# Patient Record
Sex: Male | Born: 1975 | Race: White | Hispanic: No | Marital: Single | Smoking: Current every day smoker
Health system: Southern US, Community
[De-identification: ages and names within clinical notes are randomized; demographics above are authoritative.]

## PROBLEM LIST (undated history)

## (undated) DIAGNOSIS — L299 Pruritus, unspecified: Secondary | ICD-10-CM

## (undated) DIAGNOSIS — R42 Dizziness and giddiness: Secondary | ICD-10-CM

## (undated) DIAGNOSIS — R531 Weakness: Secondary | ICD-10-CM

## (undated) DIAGNOSIS — U071 COVID-19: Secondary | ICD-10-CM

## (undated) DIAGNOSIS — L02414 Cutaneous abscess of left upper limb: Secondary | ICD-10-CM

## (undated) DIAGNOSIS — R55 Syncope and collapse: Secondary | ICD-10-CM

## (undated) DIAGNOSIS — R52 Pain, unspecified: Secondary | ICD-10-CM

## (undated) DIAGNOSIS — R0602 Shortness of breath: Secondary | ICD-10-CM

## (undated) DIAGNOSIS — R079 Chest pain, unspecified: Secondary | ICD-10-CM

## (undated) HISTORY — DX: Pain, unspecified: R52

## (undated) HISTORY — DX: Dizziness and giddiness: R42

## (undated) HISTORY — PX: NOSE SURGERY: SHX723

## (undated) HISTORY — DX: COVID-19: U07.1

## (undated) HISTORY — PX: CLAVICLE SURGERY: SHX598

## (undated) HISTORY — DX: Shortness of breath: R06.02

## (undated) HISTORY — DX: Syncope and collapse: R55

## (undated) HISTORY — DX: Chest pain, unspecified: R07.9

## (undated) HISTORY — DX: Pruritus, unspecified: L29.9

## (undated) HISTORY — DX: Weakness: R53.1

## (undated) HISTORY — DX: Cutaneous abscess of left upper limb: L02.414

---

## 2010-01-01 ENCOUNTER — Emergency Department (HOSPITAL_COMMUNITY): Admission: EM | Admit: 2010-01-01 | Discharge: 2010-01-01 | Payer: Self-pay | Admitting: Family Medicine

## 2011-09-23 ENCOUNTER — Emergency Department (HOSPITAL_COMMUNITY)
Admission: EM | Admit: 2011-09-23 | Discharge: 2011-09-23 | Disposition: A | Discharge status: Home / Self Care | Payer: Self-pay | Attending: Emergency Medicine | Admitting: Emergency Medicine

## 2011-09-23 DIAGNOSIS — L02219 Cutaneous abscess of trunk, unspecified: Secondary | ICD-10-CM | POA: Insufficient documentation

## 2012-02-19 ENCOUNTER — Observation Stay (HOSPITAL_COMMUNITY)
Admission: EM | Admit: 2012-02-19 | Discharge: 2012-02-19 | Disposition: A | Discharge status: Home Health | Payer: Self-pay | Attending: Emergency Medicine | Admitting: Emergency Medicine

## 2012-02-19 ENCOUNTER — Encounter (HOSPITAL_COMMUNITY): Payer: Self-pay | Admitting: *Deleted

## 2012-02-19 ENCOUNTER — Observation Stay (HOSPITAL_COMMUNITY): Payer: Self-pay

## 2012-02-19 ENCOUNTER — Encounter (HOSPITAL_COMMUNITY): Payer: Self-pay | Admitting: Emergency Medicine

## 2012-02-19 ENCOUNTER — Emergency Department (INDEPENDENT_AMBULATORY_CARE_PROVIDER_SITE_OTHER)
Admission: EM | Admit: 2012-02-19 | Discharge: 2012-02-19 | Disposition: A | Discharge status: Nursing Home (Includes ICF) | Payer: Self-pay | Source: Home / Self Care | Attending: Emergency Medicine | Admitting: Emergency Medicine

## 2012-02-19 DIAGNOSIS — IMO0002 Reserved for concepts with insufficient information to code with codable children: Secondary | ICD-10-CM

## 2012-02-19 DIAGNOSIS — L02414 Cutaneous abscess of left upper limb: Secondary | ICD-10-CM

## 2012-02-19 DIAGNOSIS — L0291 Cutaneous abscess, unspecified: Secondary | ICD-10-CM

## 2012-02-19 DIAGNOSIS — I891 Lymphangitis: Secondary | ICD-10-CM

## 2012-02-19 DIAGNOSIS — F17200 Nicotine dependence, unspecified, uncomplicated: Secondary | ICD-10-CM | POA: Insufficient documentation

## 2012-02-19 LAB — BASIC METABOLIC PANEL
CO2: 26 mEq/L (ref 19–32)
Chloride: 96 mEq/L (ref 96–112)
Glucose, Bld: 124 mg/dL — ABNORMAL HIGH (ref 70–99)
Sodium: 133 mEq/L — ABNORMAL LOW (ref 135–145)

## 2012-02-19 LAB — CBC
Hemoglobin: 12.5 g/dL — ABNORMAL LOW (ref 13.0–17.0)
Platelets: 465 10*3/uL — ABNORMAL HIGH (ref 150–400)
RBC: 4.15 MIL/uL — ABNORMAL LOW (ref 4.22–5.81)
WBC: 11.4 10*3/uL — ABNORMAL HIGH (ref 4.0–10.5)

## 2012-02-19 MED ORDER — FENTANYL CITRATE 0.05 MG/ML IJ SOLN
100.0000 ug | Freq: Once | INTRAMUSCULAR | Status: AC
  Administered 2012-02-19: 100 ug via INTRAVENOUS
  Filled 2012-02-19: qty 2

## 2012-02-19 MED ORDER — ZOLPIDEM TARTRATE 5 MG PO TABS: 5.0000 mg | ORAL_TABLET | Freq: Every evening | ORAL | Status: DC | PRN

## 2012-02-19 MED ORDER — VANCOMYCIN HCL IN DEXTROSE 1-5 GM/200ML-% IV SOLN: 1000.0000 mg | Freq: Two times a day (BID) | INTRAVENOUS | Status: DC

## 2012-02-19 MED ORDER — OXYCODONE-ACETAMINOPHEN 5-325 MG PO TABS: 1.0000 | ORAL_TABLET | ORAL | Status: AC | PRN

## 2012-02-19 MED ORDER — AMOXICILLIN-POT CLAVULANATE 875-125 MG PO TABS: 1.0000 | ORAL_TABLET | Freq: Two times a day (BID) | ORAL | Status: AC

## 2012-02-19 MED ORDER — HYDROMORPHONE HCL PF 1 MG/ML IJ SOLN
1.0000 mg | Freq: Once | INTRAMUSCULAR | Status: AC
  Administered 2012-02-19: 1 mg via INTRAVENOUS
  Filled 2012-02-19: qty 1

## 2012-02-19 MED ORDER — ONDANSETRON HCL 4 MG/2ML IJ SOLN
4.0000 mg | Freq: Four times a day (QID) | INTRAMUSCULAR | Status: DC | PRN
  Administered 2012-02-19: 4 mg via INTRAVENOUS
  Filled 2012-02-19: qty 2

## 2012-02-19 MED ORDER — MORPHINE SULFATE 4 MG/ML IJ SOLN
4.0000 mg | INTRAMUSCULAR | Status: DC | PRN
  Administered 2012-02-19 (×2): 4 mg via INTRAVENOUS
  Filled 2012-02-19 (×2): qty 1

## 2012-02-19 MED ORDER — VANCOMYCIN HCL IN DEXTROSE 1-5 GM/200ML-% IV SOLN
1000.0000 mg | Freq: Once | INTRAVENOUS | Status: AC
  Administered 2012-02-19: 1000 mg via INTRAVENOUS
  Filled 2012-02-19: qty 200

## 2012-02-19 NOTE — ED Notes (Signed)
Patient transported to Ultrasound 

## 2012-02-19 NOTE — ED Notes (Signed)
Returned from ultrasound.

## 2012-02-19 NOTE — ED Notes (Signed)
Patient sent from Urgent Care for evaluation of his upper left arm.  Patient left upper arm is red and swollen and warm to touch.  Patient noticed the abcess on Monday

## 2012-02-19 NOTE — ED Provider Notes (Signed)
History     CSN: [id number]  Arrival date & time 02/19/12  1047   First MD Initiated Contact with Patient 02/19/12 1234      Chief Complaint  Patient presents with  . Cellulitis    (Consider location/radiation/quality/duration/timing/severity/associated sxs/prior treatment) HPI Patient presents as a referral from urgent care do to abscess and cellulitis of his left upper extremity. He states that he noted the infection approximately 3 days ago. There is a large area of swelling and pain on the medial side of his left upper extremity. The redness extends to his axilla and down to his wrist on the left side. He denies fevers chills nausea vomiting. He states that the redness improved since yesterday as he took a friend's ciprofloxacin. Symptoms are worse with movement and palpation. There no other associated systemic symptoms. There's been no drainage from the wound. 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  Past Medical History  Diagnosis Date  . Abscess of arm, left     upper   . Weakness     History reviewed. No pertinent past surgical history.  Family History  Problem Relation Age of Onset  . Cancer Mother     lung  . Cancer Father     lung    History  Substance Use Topics  . Smoking status: Current Everyday Smoker -- 1.0 packs/day    Types: Cigarettes  . Smokeless tobacco: Never Used  . Alcohol Use: Yes     daily      Review of Systems ROS reviewed and otherwise negative except for mentioned in HPI  Allergies  Review of patient's allergies indicates no known allergies.  Home Medications   Current Outpatient Rx  Name Route Sig Dispense Refill  . AMOXICILLIN-POT CLAVULANATE 875-125 MG PO TABS Oral Take 1 tablet by mouth 2 (two) times daily. 20 tablet 0  . OXYCODONE-ACETAMINOPHEN 5-325 MG PO TABS Oral Take 1 tablet by mouth every 4 (four) hours as needed for pain. 30 tablet 0    BP 105/57  Pulse 80  Temp(Src)  97.5 F (36.4 C) (Oral)  Resp 16  SpO2 97% Vitals reviewed Physical Exam Physical Examination: General appearance - alert, well appearing, and in no distress Mental status - alert, oriented to person, place, and time Mouth - mucous membranes moist, pharynx normal without lesions Chest - clear to auscultation, no wheezes, rales or rhonchi, symmetric air entry Heart - normal rate, regular rhythm, normal S1, S2, no murmurs, rubs, clicks or gallops Abdomen - soft, nontender, nondistended, no masses or organomegaly Musculoskeletal - no joint tenderness, deformity or swelling Extremities - peripheral pulses normal, no pedal edema, no clubbing or cyanosis Skin - normal skin turgor, erythema overlying left upper extremity on medial surface from axilla to wrist, large erythematous nodule approx 5cm over medial surface of upper arm just proximal to elbow, + fluctuant, no overlying bruit or pulse, + ttp  ED Course  Procedures (including critical care time)  5:02 PM discussed patient and ultrasound findings with Dr. Lindie Spruce, surgery- I have reviewed the ultrasound as well.  Surgery will consult on patient in the CDU on cellulitis protocol.  IV vanc ordered.   5:20 PM Dr. Lindie Spruce is at bedside, plans to drain abscess  Labs Reviewed  CBC - Abnormal; Notable for the following:    WBC 11.4 (*)    RBC 4.15 (*)    Hemoglobin 12.5 (*)    HCT 37.1 (*)  Platelets 465 (*)    All other components within normal limits  BASIC METABOLIC PANEL - Abnormal; Notable for the following:    Sodium 133 (*)    Potassium 3.4 (*)    Glucose, Bld 124 (*)    All other components within normal limits  CULTURE, BLOOD (ROUTINE X 2)  CULTURE, BLOOD (ROUTINE X 2)  WOUND CULTURE   No results found.   1. Abscess       MDM  Patient presenting with left upper extremity cellulitis and most likely abscess of his left upper arm. Area is large approximately 5 or 6 cm in diameter. We'll do to proximity to vasculature of  upper arm and ultrasound was obtained. I suspect DVT to be much less likely. On ultrasound there is a large complicated area of fluid with some vascularity associated. Patient had been placed on the CDU for cellulitis protocol and given IV vancomycin and do to the surrounding cellulitis. I discussed his case with Dr. Lindie Spruce who has seen patient in the CDU and is planning to drain the abscess. After Dr. Lindie Spruce saw the patient he discharged this patient on oral antibiotics to followup in his clinic in 2 days' time. I was not consulted about the plan for discharge        Ethelda Chick, MD 02/22/12 [phone]

## 2012-02-19 NOTE — ED Notes (Signed)
Denies injury. Skin marked and dated per protocol. Awaiting vascular study.

## 2012-02-19 NOTE — ED Notes (Signed)
Pt states he has had an arm abscess since Monday. The swelling and redness was all the way down to his wrist but got better with some Cipro of his friends he took for a few days. Pt has a past history of IV drug use.

## 2012-02-19 NOTE — Discharge Instructions (Signed)
Change the outer dressing tomorrow. Leave the packing in. Call Dr. Lindie Spruce for an appointment to be seen on Friday. Abscess An abscess (boil or furuncle) is an infected area that contains a collection of pus.  SYMPTOMS Signs and symptoms of an abscess include pain, tenderness, redness, or hardness. You may feel a moveable soft area under your skin. An abscess can occur anywhere in the body.  TREATMENT  A surgical cut (incision) may be made over your abscess to drain the pus. Gauze may be packed into the space or a drain may be looped through the abscess cavity (pocket). This provides a drain that will allow the cavity to heal from the inside outwards. The abscess may be painful for a few days, but should feel much better if it was drained.  Your abscess, if seen early, may not have localized and may not have been drained. If not, another appointment may be required if it does not get better on its own or with medications. HOME CARE INSTRUCTIONS   Only take over-the-counter or prescription medicines for pain, discomfort, or fever as directed by your caregiver.   Take your antibiotics as directed if they were prescribed. Finish them even if you start to feel better.   Keep the skin and clothes clean around your abscess.   If the abscess was drained, you will need to use gauze dressing to collect any draining pus. Dressings will typically need to be changed 3 or more times a day.   The infection may spread by skin contact with others. Avoid skin contact as much as possible.   Practice good hygiene. This includes regular hand washing, cover any draining skin lesions, and do not share personal care items.   If you participate in sports, do not share athletic equipment, towels, whirlpools, or personal care items. Shower after every practice or tournament.   If a draining area cannot be adequately covered:   Do not participate in sports.   Children should not participate in day care until the wound  has healed or drainage stops.   If your caregiver has given you a follow-up appointment, it is very important to keep that appointment. Not keeping the appointment could result in a much worse infection, chronic or permanent injury, pain, and disability. If there is any problem keeping the appointment, you must call back to this facility for assistance.  SEEK MEDICAL CARE IF:   You develop increased pain, swelling, redness, drainage, or bleeding in the wound site.   You develop signs of generalized infection including muscle aches, chills, fever, or a general ill feeling.   You have an oral temperature above 102 F (38.9 C).  MAKE SURE YOU:   Understand these instructions.   Will watch your condition.   Will get help right away if you are not doing well or get worse.  Document Released: 09/25/2005 Document Revised: 08/28/2011 Document Reviewed: 07/19/2008 [facility] Patient Information 2012 [place], [place].

## 2012-02-19 NOTE — Consult Note (Signed)
Reason for Consult:left arm abscess Referring Physician: Dr. Oran Rein Jorge Carter is an 36 y.o. male.  HPI: Several days of pain and swelling in left upper extremity.  Has gone down now some with the use of a friends antibiotic, Cipro.  History reviewed. No pertinent past medical history.  History reviewed. No pertinent past surgical history.  History reviewed. No pertinent family history.  Social History:  reports that he has been smoking Cigarettes.  He has been smoking about 1 pack per day. He does not have any smokeless tobacco history on file. He reports that he drinks alcohol. His drug history not on file.  Allergies: No Known Allergies  Medications: I have reviewed the patient's current medications.  Results for orders placed during the hospital encounter of 02/19/12 (from the past 48 hour(s))  CBC     Status: Abnormal   Collection Time   02/19/12  1:20 PM      Component Value Range Comment   WBC 11.4 (*) 4.0 - 10.5 (K/uL)    RBC 4.15 (*) 4.22 - 5.81 (MIL/uL)    Hemoglobin 12.5 (*) 13.0 - 17.0 (g/dL)    HCT 40.9 (*) 81.1 - 52.0 (%)    MCV 89.4  78.0 - 100.0 (fL)    MCH 30.1  26.0 - 34.0 (pg)    MCHC 33.7  30.0 - 36.0 (g/dL)    RDW 91.4  78.2 - 95.6 (%)    Platelets 465 (*) 150 - 400 (K/uL)   BASIC METABOLIC PANEL     Status: Abnormal   Collection Time   02/19/12  1:20 PM      Component Value Range Comment   Sodium 133 (*) 135 - 145 (mEq/L)    Potassium 3.4 (*) 3.5 - 5.1 (mEq/L)    Chloride 96  96 - 112 (mEq/L)    CO2 26  19 - 32 (mEq/L)    Glucose, Bld 124 (*) 70 - 99 (mg/dL)    BUN 7  6 - 23 (mg/dL)    Creatinine, Ser 2.13  0.50 - 1.35 (mg/dL)    Calcium 9.9  8.4 - 10.5 (mg/dL)    GFR calc non Af Amer >90  >90 (mL/min)    GFR calc Af Amer >90  >90 (mL/min)     [place] Extrem Up Left Ltd  02/19/2012  *RADIOLOGY REPORT*  Clinical Data: History of left upper arm pain and swelling.  ULTRASOUND LEFT UPPER EXTREMITY LIMITED  Technique:  Ultrasound examination of the  region of interest in the left upper extremity was performed.  Comparison:  No priors.  Findings: In the medial aspect of the left arm there is a complex collection of heterogeneous echotexture which appears to have the mobile internal architecture (likely fluid-containing), and demonstrates some increased through transmission, concerning for a hematoma. This lesion measures up to 5.7 x 3.0 x 4.7 cm. Hypervascularity is noted around the periphery of the lesion, and there is some evidence of internal blood flow.  IMPRESSION: 1. Complex appearing lesion with heterogeneous internal echotexture measuring 5.7 x 3.0 x 4.7 cm, as detailed above, favored to represent a hematoma.  Other differential considerations would include an abscess.  Clinical correlation is recommended, and repeat imaging may be warranted should this lesion increase in size or become more symptomatic.  Original Report Authenticated By: Jorge Carter, M.D.    Review of Systems  Constitutional: Negative.  Negative for fever and chills.  HENT: Negative.   Eyes: Negative.   Respiratory: Negative.  Cardiovascular: Negative.   Gastrointestinal: Negative.   Genitourinary: Negative.   Skin: Negative.   Neurological: Negative.   Endo/Heme/Allergies: Negative.    Blood pressure 105/57, pulse 80, temperature 97.5 F (36.4 C), temperature source Oral, resp. rate 16, SpO2 97.00%. Physical Exam  Constitutional: He is oriented to person, place, and time. He appears well-developed and well-nourished.  HENT:  Head: Normocephalic and atraumatic.  Eyes: Conjunctivae and EOM are normal. Pupils are equal, round, and reactive to light.  Neck: Normal range of motion. Neck supple.  Cardiovascular: Normal rate, normal heart sounds and intact distal pulses.   Respiratory: Effort normal and breath sounds normal.  GI: Soft. Bowel sounds are normal.  Musculoskeletal: He exhibits tenderness (left upper arm tenderness).       Left elbow: He exhibits  swelling (large medial abscess). tenderness found.       Arms: Neurological: He is alert and oriented to person, place, and time.  Skin: Skin is warm and dry.    Assessment/Plan: Left upper arm abscess  Incision and drainage in the ED with packing Cultures to be taken  I&D performed in the ED.  Culture sent.  Packed with 2 feet of 1/2 inch iodoform NuGauze.  Patient sent home on PO Augmentin and Percocet.   Jorge Carter,Senta Kantor O 02/19/2012, 5:21 PM

## 2012-02-19 NOTE — ED Provider Notes (Signed)
History     CSN: [id number]  Arrival date & time 02/19/12  4098   First MD Initiated Contact with Patient 02/19/12 [phone]      Chief Complaint  Patient presents with  . Arm Swelling    (Consider location/radiation/quality/duration/timing/severity/associated sxs/prior treatment) HPI Comments: Patient presents urgent care today complaining of a left upper arm infection worsening in the last 3 days, patient reports he was taking an oral antibiotic from a friend of his and they'll bit better. Current infection has tracking down to his arm and upper arm as well. 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 (Denies visualizing any insect or spider)  Patient denies any fevers, nausea vomiting or abdominal pains, or paresthesias.  The history is provided by the patient.    History reviewed. No pertinent past medical history.  History reviewed. No pertinent past surgical history.  History reviewed. No pertinent family history.  History  Substance Use Topics  . Smoking status: Current Everyday Smoker -- 1.0 packs/day    Types: Cigarettes  . Smokeless tobacco: Not on file  . Alcohol Use: Yes     daily      Review of Systems  Constitutional: Negative for fever, chills, activity change and fatigue.  Respiratory: Negative for cough and shortness of breath.   Gastrointestinal: Negative for nausea, vomiting and abdominal pain.  Skin: Positive for color change and rash.  Neurological: Negative for dizziness, numbness and headaches.    Allergies  Review of patient's allergies indicates no known allergies.  Home Medications  No current outpatient prescriptions on file.  BP 114/78  Pulse 80  Temp(Src) 98.4 F (36.9 C) (Oral)  Resp 23  SpO2 98%  Physical Exam  Constitutional: He appears well-developed and well-nourished.  Eyes: Conjunctivae are normal.  Neck: Neck supple.  Abdominal: Soft.  Skin: Rash  noted. He is not diaphoretic. There is erythema.       ED Course  Procedures (including critical care time)  Labs Reviewed - No data to display No results found.   1. Abscess of arm, left   2. Lymphangitis       MDM  Patient with a localized abscess to internal aspect of left upper arm and cellulitic changes in lymphangitis tracking both upwards towards axillary area and downwards to mid region of forearm. Patient is a former IV drug user currently denying any recent use. 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 Patient denies any abdominal pain or gastrointestinal symptoms denies any fevers.        Jimmie Molly, MD 02/19/12 1020

## 2012-02-19 NOTE — Procedures (Signed)
Incision and Drainage Procedure Note  Pre-operative Diagnosis: Left upper extremity abscess  Post-operative Diagnosis: same  Indications: Warm, tense upper extremity abscess  Anesthesia: 1% plain lidocaine  Procedure Details  The procedure, risks and complications have been discussed in detail (including, but not limited to airway compromise, infection, bleeding) with the patient, and the patient has signed consent to the procedure.  The skin was sterilely prepped and draped over the affected area in the usual fashion. After adequate local anesthesia, I&D with a #11 blade was performed on the left, upper, extremity. Purulent drainage: present, left, upper, extremity.  Cultures were sent.  Bloody drainage also. The patient was observed until stable.  Findings: Purulent drainage from necrotic abscess cavity  EBL: 25 cc's  Drains: None  Condition: Tolerated procedure well and Stable   Complications: none  Deepti Gunawan O. Gae Bon, MD, FACS [phone] [facility].

## 2012-02-19 NOTE — ED Notes (Signed)
Dr. Lindie Spruce at bedside performing I&D

## 2012-02-21 ENCOUNTER — Encounter (INDEPENDENT_AMBULATORY_CARE_PROVIDER_SITE_OTHER): Payer: Self-pay | Admitting: General Surgery

## 2012-02-21 ENCOUNTER — Ambulatory Visit (INDEPENDENT_AMBULATORY_CARE_PROVIDER_SITE_OTHER): Payer: Self-pay | Admitting: General Surgery

## 2012-02-21 VITALS — BP 102/70 | HR 68 | Temp 97.8°F | Resp 18 | Ht 72.0 in | Wt 153.4 lb

## 2012-02-21 DIAGNOSIS — IMO0002 Reserved for concepts with insufficient information to code with codable children: Secondary | ICD-10-CM

## 2012-02-21 DIAGNOSIS — L02414 Cutaneous abscess of left upper limb: Secondary | ICD-10-CM

## 2012-02-21 DIAGNOSIS — Z720 Tobacco use: Secondary | ICD-10-CM

## 2012-02-21 DIAGNOSIS — F17200 Nicotine dependence, unspecified, uncomplicated: Secondary | ICD-10-CM

## 2012-02-21 HISTORY — DX: Tobacco use: Z72.0

## 2012-02-21 NOTE — Progress Notes (Signed)
Operation: Incision and drainage of left upper extremity abscess by Dr. Lindie Spruce  Date: February 19, 2012   HPI:  He is here for a postoperative visit. The area is still very sore.   Physical Exam: Left upper extremity-the dressing was removed and the packing removed; the wound is clean; there is decreased erythema based on the previous marks on his skin with a marking pen.  The wound was packed lightly with saline moistened gauze followed by dry dressing and he was taught how to do this.   Assessment: Left upper extremity abscess status post incision and drainage-wound is clean and erythematous has decrease; he is on Augmentin.  Plan:  Start normal saline again to dry dressing changes tomorrow. If the redness is resolved by next week he can return to work. Return visit with Dr. Lindie Spruce in 10 days.

## 2012-02-21 NOTE — Patient Instructions (Signed)
Start dressing changes as instructed tomorrow.  Put saline moistened gauze into the wound and then covered with a bulky dry dressing every day.   You may start work next week if the redness is gone and you can keep the area completely covered.

## 2012-02-23 LAB — WOUND CULTURE

## 2012-02-24 NOTE — ED Notes (Signed)
I/D done treated with Vancomycin

## 2012-02-24 NOTE — ED Notes (Signed)
He is being followed by [facility].

## 2012-02-25 LAB — CULTURE, BLOOD (ROUTINE X 2)
Culture  Setup Time: 201302202238
Culture: NO GROWTH
Culture: NO GROWTH

## 2012-02-28 ENCOUNTER — Encounter (INDEPENDENT_AMBULATORY_CARE_PROVIDER_SITE_OTHER): Payer: Self-pay | Admitting: General Surgery

## 2012-03-17 ENCOUNTER — Encounter (INDEPENDENT_AMBULATORY_CARE_PROVIDER_SITE_OTHER): Payer: Self-pay | Admitting: General Surgery

## 2012-07-05 ENCOUNTER — Emergency Department (HOSPITAL_COMMUNITY)
Admission: EM | Admit: 2012-07-05 | Discharge: 2012-07-07 | Disposition: A | Discharge status: Rehabilitation Facility | Payer: Self-pay | Attending: Emergency Medicine | Admitting: Emergency Medicine

## 2012-07-05 ENCOUNTER — Encounter (HOSPITAL_COMMUNITY): Payer: Self-pay | Admitting: *Deleted

## 2012-07-05 DIAGNOSIS — F411 Generalized anxiety disorder: Secondary | ICD-10-CM | POA: Insufficient documentation

## 2012-07-05 DIAGNOSIS — R6883 Chills (without fever): Secondary | ICD-10-CM | POA: Insufficient documentation

## 2012-07-05 DIAGNOSIS — F1120 Opioid dependence, uncomplicated: Secondary | ICD-10-CM | POA: Insufficient documentation

## 2012-07-05 DIAGNOSIS — F17200 Nicotine dependence, unspecified, uncomplicated: Secondary | ICD-10-CM | POA: Insufficient documentation

## 2012-07-05 DIAGNOSIS — R61 Generalized hyperhidrosis: Secondary | ICD-10-CM | POA: Insufficient documentation

## 2012-07-05 LAB — POCT I-STAT, CHEM 8
Calcium, Ion: 1.26 mmol/L — ABNORMAL HIGH (ref 1.12–1.23)
Chloride: 101 mEq/L (ref 96–112)
HCT: 37 % — ABNORMAL LOW (ref 39.0–52.0)
Sodium: 140 mEq/L (ref 135–145)

## 2012-07-05 MED ORDER — IBUPROFEN 400 MG PO TABS
600.0000 mg | ORAL_TABLET | Freq: Three times a day (TID) | ORAL | Status: DC | PRN
  Administered 2012-07-06: 600 mg via ORAL
  Filled 2012-07-05: qty 1

## 2012-07-05 MED ORDER — NAPROXEN 250 MG PO TABS
500.0000 mg | ORAL_TABLET | Freq: Two times a day (BID) | ORAL | Status: DC | PRN
  Administered 2012-07-06: 500 mg via ORAL
  Filled 2012-07-05: qty 2

## 2012-07-05 MED ORDER — ONDANSETRON HCL 8 MG PO TABS: 4.0000 mg | ORAL_TABLET | Freq: Three times a day (TID) | ORAL | Status: DC | PRN

## 2012-07-05 MED ORDER — LOPERAMIDE HCL 2 MG PO CAPS: 2.0000 mg | ORAL_CAPSULE | ORAL | Status: DC | PRN

## 2012-07-05 MED ORDER — ZOLPIDEM TARTRATE 5 MG PO TABS
5.0000 mg | ORAL_TABLET | Freq: Every evening | ORAL | Status: DC | PRN
  Administered 2012-07-06 (×2): 5 mg via ORAL
  Filled 2012-07-05 (×2): qty 1

## 2012-07-05 MED ORDER — ACETAMINOPHEN 325 MG PO TABS
650.0000 mg | ORAL_TABLET | ORAL | Status: DC | PRN
  Administered 2012-07-06 (×2): 650 mg via ORAL
  Filled 2012-07-05 (×2): qty 2

## 2012-07-05 MED ORDER — METHOCARBAMOL 500 MG PO TABS
500.0000 mg | ORAL_TABLET | Freq: Three times a day (TID) | ORAL | Status: DC | PRN
  Filled 2012-07-05: qty 1

## 2012-07-05 MED ORDER — NICOTINE 21 MG/24HR TD PT24
21.0000 mg | MEDICATED_PATCH | Freq: Every day | TRANSDERMAL | Status: DC
  Administered 2012-07-06: 21 mg via TRANSDERMAL
  Filled 2012-07-05: qty 1

## 2012-07-05 MED ORDER — HYDROXYZINE HCL 25 MG PO TABS
25.0000 mg | ORAL_TABLET | Freq: Four times a day (QID) | ORAL | Status: DC | PRN
Start: 2012-07-05 — End: 2012-07-07
  Administered 2012-07-06: 25 mg via ORAL
  Filled 2012-07-05: qty 1

## 2012-07-05 MED ORDER — ALUM & MAG HYDROXIDE-SIMETH 200-200-20 MG/5ML PO SUSP: 30.0000 mL | ORAL | Status: DC | PRN

## 2012-07-05 MED ORDER — DICYCLOMINE HCL 20 MG PO TABS
20.0000 mg | ORAL_TABLET | Freq: Four times a day (QID) | ORAL | Status: DC | PRN
  Filled 2012-07-05: qty 1

## 2012-07-05 MED ORDER — ONDANSETRON 4 MG PO TBDP: 4.0000 mg | ORAL_TABLET | Freq: Four times a day (QID) | ORAL | Status: DC | PRN

## 2012-07-05 MED ORDER — CLONIDINE HCL 0.1 MG PO TABS
0.1000 mg | ORAL_TABLET | Freq: Two times a day (BID) | ORAL | Status: DC | PRN
  Administered 2012-07-06 (×2): 0.1 mg via ORAL
  Filled 2012-07-05 (×2): qty 1

## 2012-07-05 NOTE — ED Notes (Signed)
Patient presents stating he is looking for help for detox from ETOH and Heroin.  Stated he last used both earlier today.  Has tried to get off both by himself but stated "the withdrawals are to hard to deal with".  At this time patient is alert, oriented.  Oriented to the room, use of the call bell and TV.  Instructed no smoking and patient voiced understanding.  Stated using Heroin for the last 8 years and ETOH for the last 15 years.

## 2012-07-05 NOTE — ED Notes (Addendum)
Pt states wants Detox from alcohol and heroin. Pt states the last time he used was this morning. Pt states normally drink 2-3 40 oz. Pt states has not tried to detox before. Pt denies thoughts of hurting himself or suicide. Pt denies HI thoughts as well. Pt does admit to depression. Pt denies Hallucination or, just feels nauseated and chills.

## 2012-07-05 NOTE — ED Provider Notes (Signed)
History     CSN: [id number]  Arrival date & time 07/05/12  2251   First MD Initiated Contact with Patient 07/05/12 2328      Chief Complaint  Patient presents with  . Medical Clearance    (Consider location/radiation/quality/duration/timing/severity/associated sxs/prior treatment) HPI Hx provided by the patient. Here requesting detox from heroin. Last use today, usually uses about a gram/ day. Never been thru detox program but has tried to stop on his own. Having some chills and sweats now. No vomiting or diarrhea. No ABD pain. No SI/ HI, no h/o depression. No fevers or skin lesions, usually shoots up in his R arm. Mod in severity.  Past Medical History  Diagnosis Date  . Abscess of arm, left     upper   . Weakness     History reviewed. No pertinent past surgical history.  Family History  Problem Relation Age of Onset  . Cancer Mother     lung  . Cancer Father     lung    History  Substance Use Topics  . Smoking status: Current Everyday Smoker -- 1.0 packs/day    Types: Cigarettes  . Smokeless tobacco: Never Used  . Alcohol Use: Yes     daily      Review of Systems  Constitutional: Positive for chills. Negative for fever.  HENT: Negative for neck pain and neck stiffness.   Eyes: Negative for pain.  Respiratory: Negative for shortness of breath.   Cardiovascular: Negative for chest pain.  Gastrointestinal: Negative for abdominal pain.  Genitourinary: Negative for dysuria.  Musculoskeletal: Negative for myalgias, back pain and joint swelling.  Skin: Negative for rash and wound.  Neurological: Negative for headaches.  All other systems reviewed and are negative.    Allergies  Review of patient's allergies indicates no known allergies.  Home Medications  No current outpatient prescriptions on file.  BP 127/90  Pulse 78  Temp 97.7 F (36.5 C) (Oral)  Resp 16  SpO2 98%  Physical Exam  Constitutional: He is oriented to person, place, and time. He  appears well-developed and well-nourished.  HENT:  Head: Normocephalic and atraumatic.  Eyes: Conjunctivae and EOM are normal. Pupils are equal, round, and reactive to light.  Neck: Trachea normal. Neck supple. No thyromegaly present.  Cardiovascular: Normal rate, regular rhythm, S1 normal, S2 normal and normal pulses.     No systolic murmur is present   No diastolic murmur is present  Pulses:      Radial pulses are 2+ on the right side, and 2+ on the left side.  Pulmonary/Chest: Effort normal and breath sounds normal. He has no wheezes. He has no rhonchi. He has no rales. He exhibits no tenderness.  Abdominal: Soft. Normal appearance and bowel sounds are normal. There is no tenderness. There is no CVA tenderness and negative Murphy's sign.  Musculoskeletal:       Track marks RUE, no evidence of abscess or cellulitis  Neurological: He is alert and oriented to person, place, and time. He has normal strength. No cranial nerve deficit or sensory deficit. GCS eye subscore is 4. GCS verbal subscore is 5. GCS motor subscore is 6.  Skin: Skin is warm and dry. No rash noted. He is not diaphoretic.  Psychiatric: His speech is normal.       Mildly anxious, otherwise Cooperative and appropriate    ED Course  Procedures (including critical care time)   Labs Reviewed  URINE RAPID DRUG SCREEN (HOSP PERFORMED)  CBC  ETHANOL  URINALYSIS, ROUTINE W REFLEX MICROSCOPIC    Labs ordered, UA/ UDS ordered. Not currently intoxicated. ACT consult to attempt to help with detox. No indication for PSY admit - no SI/ HI or reason for IVC at this time.   PSYCH holding orders initiated.   ACT consult requested  MDM   requestinig heroin detox. Clonidine protocol intiated.        Sunnie Nielsen, MD 07/05/12 [phone]

## 2012-07-06 ENCOUNTER — Encounter (HOSPITAL_COMMUNITY): Payer: Self-pay

## 2012-07-06 LAB — CBC
Hemoglobin: 11.7 g/dL — ABNORMAL LOW (ref 13.0–17.0)
MCV: 86.4 fL (ref 78.0–100.0)
Platelets: 289 10*3/uL (ref 150–400)
RBC: 4.18 MIL/uL — ABNORMAL LOW (ref 4.22–5.81)
WBC: 6.4 10*3/uL (ref 4.0–10.5)

## 2012-07-06 LAB — URINE MICROSCOPIC-ADD ON

## 2012-07-06 LAB — URINALYSIS, ROUTINE W REFLEX MICROSCOPIC
Nitrite: NEGATIVE
Specific Gravity, Urine: 1.037 — ABNORMAL HIGH (ref 1.005–1.030)
Urobilinogen, UA: 0.2 mg/dL (ref 0.0–1.0)
pH: 5.5 (ref 5.0–8.0)

## 2012-07-06 LAB — RAPID URINE DRUG SCREEN, HOSP PERFORMED: Amphetamines: NOT DETECTED

## 2012-07-06 NOTE — ED Notes (Signed)
Patient ambulated to the BR without difficulty

## 2012-07-06 NOTE — ED Notes (Signed)
Patient stepped out of the room to call his son on the cell phone.

## 2012-07-06 NOTE — BH Assessment (Signed)
[facility] Assessment Progress Note   This clinician spoke with Dois Davenport at [facility] in [place].  After their pre-screen was sent to them and a consumer admission form completed with [facility], she said that they could take patient for detox in the morning (07/09).  Patient said that sister will transport him from [facility] to [facility] in the morning and that she will be at hospital by about 07:00.  Nursing staff is to make sure to call [facility] as soon as patient and sister leave so they can know when to expect them.  [facility] can be contacted at [phone].

## 2012-07-06 NOTE — ED Notes (Signed)
Patient states he is getting shaky and has the chills

## 2012-07-06 NOTE — ED Notes (Signed)
Patient c/o headache

## 2012-07-06 NOTE — ED Notes (Signed)
ACT team in to see patient. 

## 2012-07-06 NOTE — BH Assessment (Signed)
Assessment Note   Jorge Carter is an 36 y.o. male.   Patient requests detox from alcohol and heroin.  Patient reports first using heroin two years ago.  Patient reports that he shots  to a whole gram daily.  Patient reports drinking between  3-4 (40oz) of beer daily for the past 15 years.  Patient reports that he has been drinking since he was 36 years old.  Patient states that he last used heroin and alcohol yesterday at 3:00 pm.   Therefore his BAL is less than 11 now.  His drug screen results states that he tested positive for cocaine and opiates.   Patient reports that he shot up a   gram of heroin but he only drank  24oz of beer.  Patient reports that he has never been to rehab.  Patient stated that he has never had a psychiatric hospitalization.  Patient endorsed the following withdrawal symptoms: cramps, runny nose, achy bones, chills, headache and sweats.  Patient denies thoughts of hurting himself or suicide. Patient denies HI thoughts.  Patient reports depression associated with current status of being homeless.  Patient denies psychosis.  Patient has a CIWA score of 11 and a COWS score of 13.   Axis I: Alcohol Dependence and Opioid Dependence  Axis II: Deferred Axis III:  Past Medical History  Diagnosis Date  . Abscess of arm, left     upper   . Weakness    Axis IV: economic problems, housing problems, occupational problems, problems related to social environment, problems with access to health care services and problems with primary support group Axis V: 41-50 serious symptoms  Past Medical History:  Past Medical History  Diagnosis Date  . Abscess of arm, left     upper   . Weakness     History reviewed. No pertinent past surgical history.  Family History:  Family History  Problem Relation Age of Onset  . Cancer Mother     lung  . Cancer Father     lung    Social History:  reports that he has been smoking Cigarettes.  He has been smoking about 1 pack per day. He  has never used smokeless tobacco. He reports that he drinks about 3 ounces of alcohol per week. He reports that he uses illicit drugs.  Additional Social History:  Alcohol / Drug Use Pain Medications: None Reported  History of alcohol / drug use?: Yes Substance #1 Name of Substance 1: Alcohol  1 - Age of First Use: 14 1 - Amount (size/oz): 3-4 (40oz) of beer  1 - Frequency: Daily 1 - Duration: varies  1 - Last Use / Amount: 4 (40oz) of beer.   Substance #2 Name of Substance 2: Heroin  2 - Age of First Use: 33 2 - Amount (size/oz): a half a gram to a whole gram daily.  2 - Frequency: Daily  2 - Duration: varies  2 - Last Use / Amount: last night   CIWA: CIWA-Ar BP: 129/84 mmHg Pulse Rate: 60  Nausea and Vomiting: mild nausea with no vomiting Tactile Disturbances: mild itching, pins and needles, burning or numbness Tremor: no tremor Auditory Disturbances: not present Paroxysmal Sweats: three Visual Disturbances: not present Anxiety: two Headache, Fullness in Head: mild Agitation: somewhat more than normal activity Orientation and Clouding of Sensorium: oriented and can do serial additions CIWA-Ar Total: 11  COWS: Clinical Opiate Withdrawal Scale (COWS) Resting Pulse Rate: Pulse Rate 81-100 Sweating: Flushed or Observable moistness on face  Restlessness: Reports difficulty sitting still, but is able to do so Pupil Size: Pupils possibly larger than normal for room light Bone or Joint Aches: Patient reports sever diffuse aching of joints/muscles Runny Nose or Tearing: Nasal stuffiness or unusually moist eyes GI Upset: Stomach cramps Tremor: Tremor can be felt, but not observed Yawning: No yawning Anxiety or Irritability: None Gooseflesh Skin: Piloerection of skin can be felt or hairs standing up on arms COWS Total Score: 13   Allergies: No Known Allergies  Home Medications:  (Not in a hospital admission)  OB/GYN Status:  No LMP for male patient.  General Assessment  Data Location of Assessment: [facility] ED ACT Assessment: Yes Living Arrangements: Other (Comment) (Homeless) Can pt return to current living arrangement?: No Admission Status: Voluntary Is patient capable of signing voluntary admission?: Yes Transfer from: Other (Comment)     Risk to self Suicidal Ideation: No Suicidal Intent: No Is patient at risk for suicide?: No Suicidal Plan?: No Access to Means: No What has been your use of drugs/alcohol within the last 12 months?: Alcohol and Heroin  Previous Attempts/Gestures: No How many times?: 0  Other Self Harm Risks: 0 Triggers for Past Attempts: Unpredictable Intentional Self Injurious Behavior: None Family Suicide History: No Recent stressful life event(s): Conflict (Comment);Job Loss;Financial Problems;Trauma (Comment) (Homeless) Persecutory voices/beliefs?: No Depression: Yes Depression Symptoms: Fatigue;Feeling worthless/self pity;Insomnia Substance abuse history and/or treatment for substance abuse?: Yes Suicide prevention information given to non-admitted patients: Not applicable  Risk to Others Homicidal Ideation: No Thoughts of Harm to Others: No Current Homicidal Intent: No Current Homicidal Plan: No Access to Homicidal Means: No Identified Victim: none reported History of harm to others?: No Assessment of Violence: None Noted Violent Behavior Description: none reported Does patient have access to weapons?: No Criminal Charges Pending?: No Does patient have a court date: No  Psychosis Hallucinations: None noted Delusions: None noted  Mental Status Report Appear/Hygiene: Disheveled Eye Contact: Poor Motor Activity: Restlessness;Tremors Speech: Logical/coherent Level of Consciousness: Quiet/awake Mood: Depressed;Despair;Ashamed/humiliated Affect: Appropriate to circumstance Anxiety Level: None Thought Processes: Coherent;Relevant Judgement: Unimpaired Orientation: Person;Place;Time;Situation Obsessive  Compulsive Thoughts/Behaviors: None  Cognitive Functioning Concentration: Decreased Memory: Recent Intact;Remote Intact IQ: Average Insight: Poor Impulse Control: Poor Appetite: Fair Weight Loss: 0  Weight Gain: 0  Sleep: Decreased Total Hours of Sleep: 5  Vegetative Symptoms: Not bathing  ADLScreening [facility]) Patient's cognitive ability adequate to safely complete daily activities?: Yes Patient able to express need for assistance with ADLs?: Yes Independently performs ADLs?: Yes  Abuse/Neglect [facility]) Physical Abuse: Denies Verbal Abuse: Denies Sexual Abuse: Denies  Prior Inpatient Therapy Prior Inpatient Therapy: No Prior Therapy Dates: na Prior Therapy Facilty/Provider(s): na Reason for Treatment: na  Prior Outpatient Therapy Prior Outpatient Therapy: No Prior Therapy Dates: na Prior Therapy Facilty/Provider(s): na Reason for Treatment: na  ADL Screening (condition at time of admission) Patient's cognitive ability adequate to safely complete daily activities?: Yes Patient able to express need for assistance with ADLs?: Yes Independently performs ADLs?: Yes Weakness of Legs: None Weakness of Arms/Hands: None     Therapy Consults (therapy consults require a physician order) PT Evaluation Needed: No OT Evalulation Needed: No SLP Evaluation Needed: No Abuse/Neglect Assessment (Assessment to be complete while patient is alone) Physical Abuse: Denies Verbal Abuse: Denies Sexual Abuse: Denies Exploitation of patient/patient's resources: Denies Self-Neglect: Denies   Consults Spiritual Care Consult Needed: No Social Work Consult Needed: No      Additional Information 1:1 In Past 12 Months?: No CIRT Risk:  No Elopement Risk: No Does patient have medical clearance?: Yes     Disposition: Pending placement at [facility]  Disposition Disposition of Patient: Referred to Patient referred to: [facility]  On Site Evaluation by:   Reviewed with  Physician:     Phillip Heal LaVerne 07/06/2012 7:29 AM

## 2012-07-07 NOTE — ED Notes (Signed)
Called [facility] and advised Marylu Lund that the patient is on his way there via his sister Stanton Kidney.

## 2012-09-26 IMAGING — US US EXTREM UP*L* LTD
2 series · 14 of 19 positions shown · non-contrast
Comparison: No priors.

CLINICAL DATA: History of left upper arm pain and swelling.

ULTRASOUND LEFT UPPER EXTREMITY LIMITED
TECHNIQUE: Ultrasound examination of the region of interest in the
left upper extremity was performed.

[Series 1: us extrem up*left* ltd · 0.08mm/px · 12 of 16 slices shown (1 of 2)]
[im 1/16]
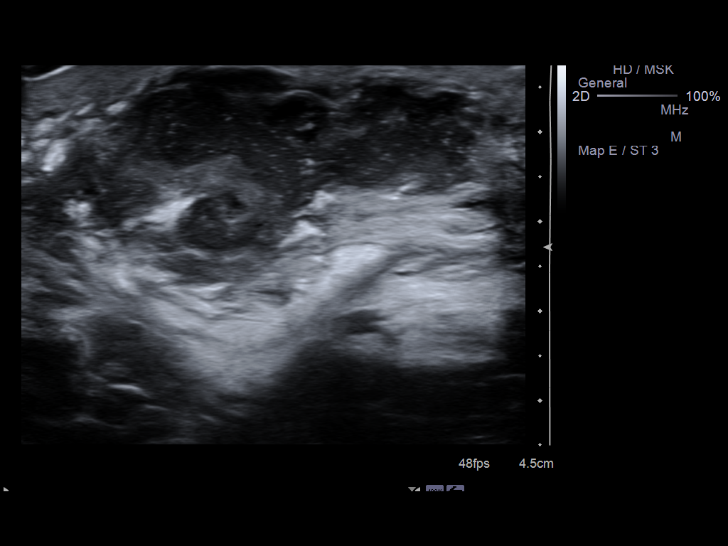
[im 3/16]
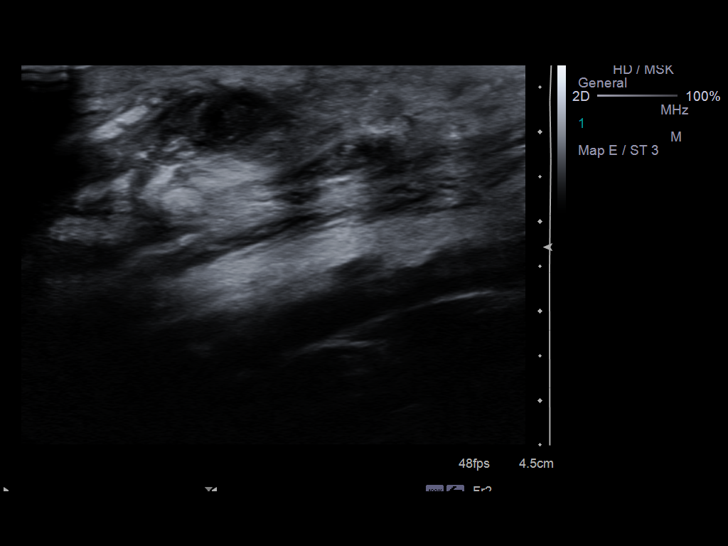
[im 4/16]
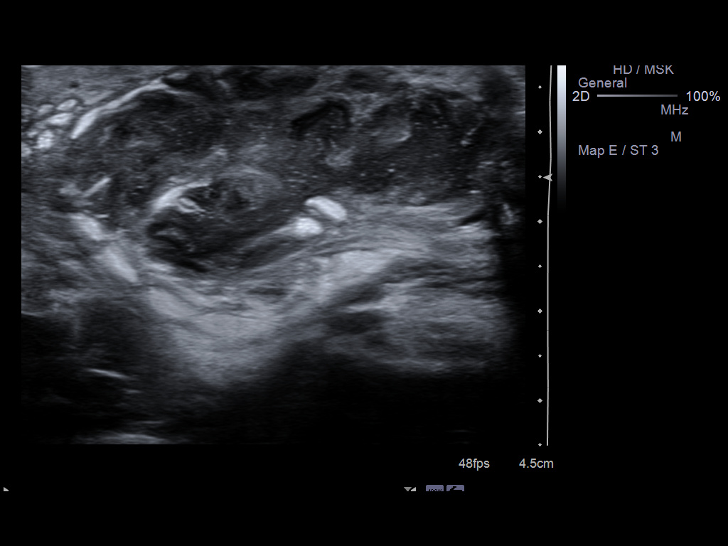
[im 5/16]
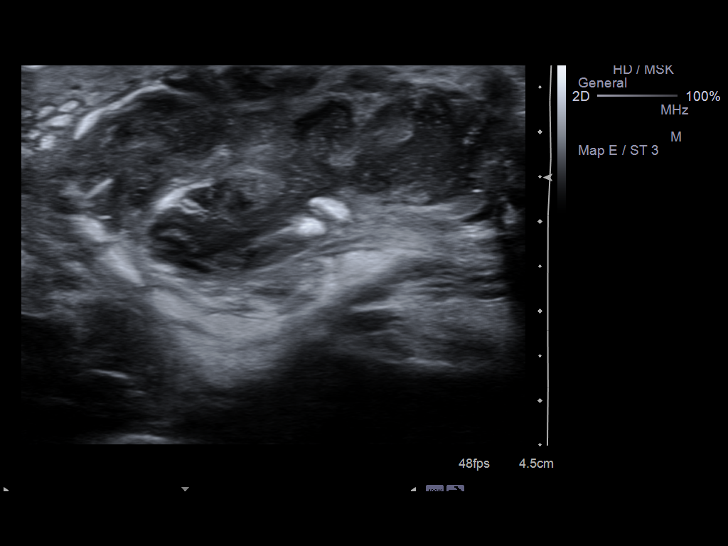
[im 7/16]
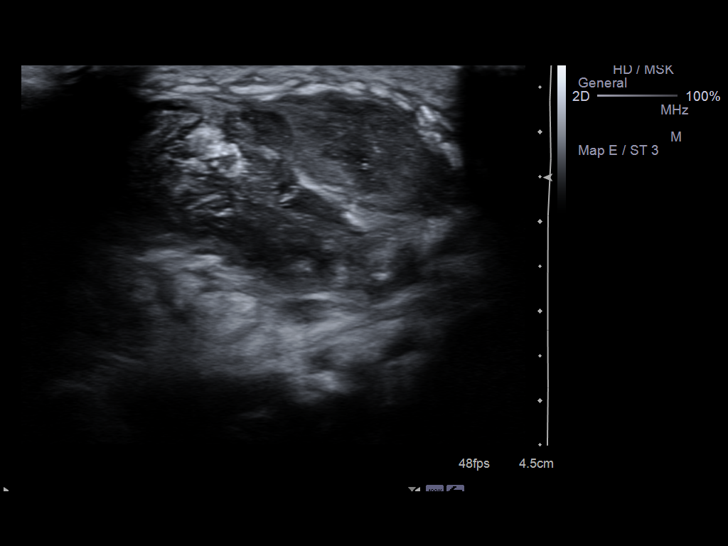
[im 8/16]
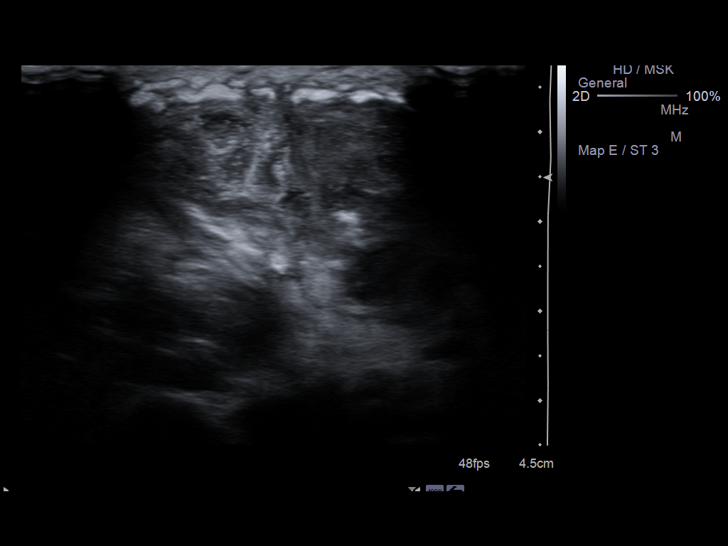
[im 9/16]
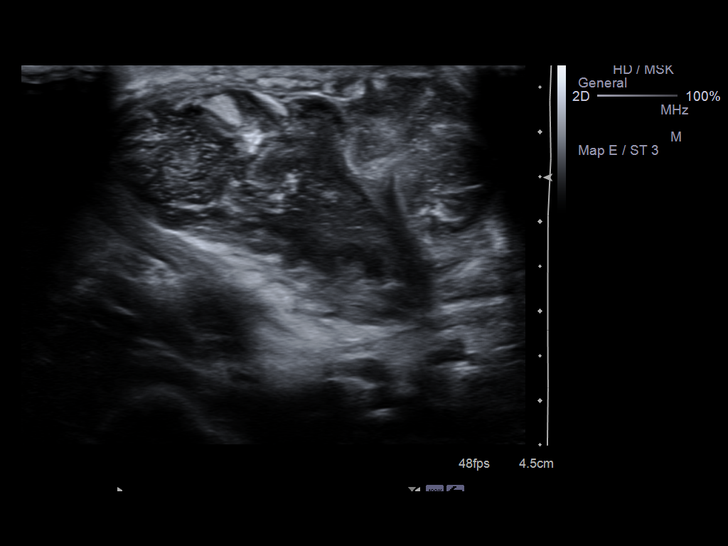
[im 11/16]
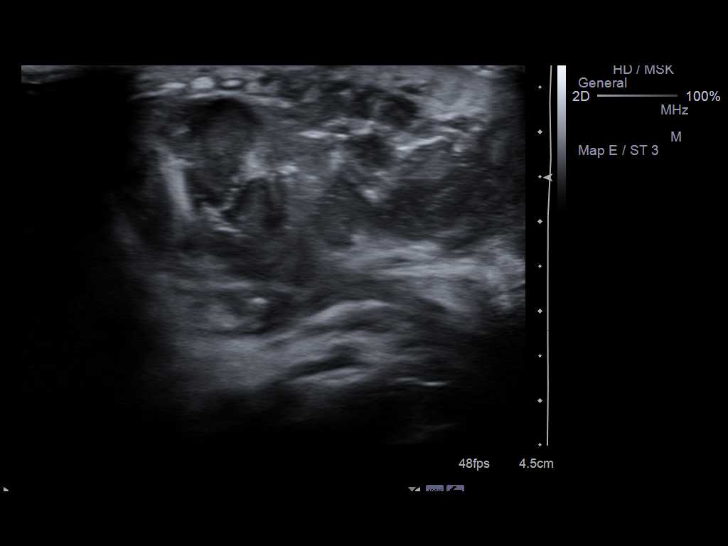
[im 12/16]
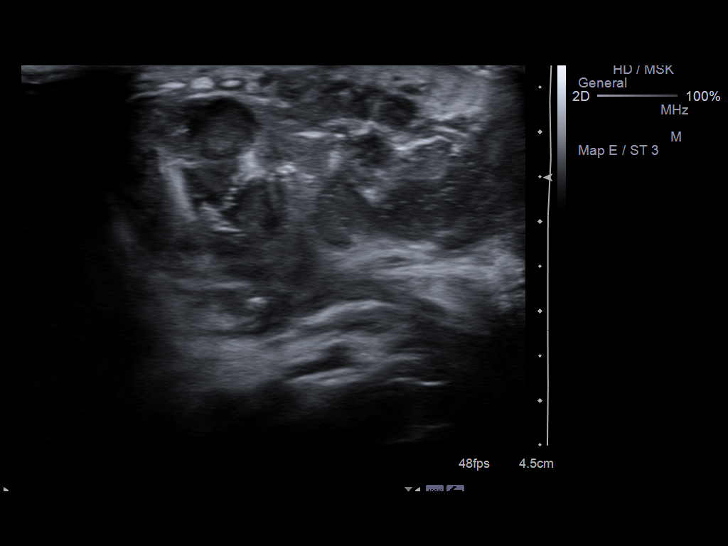
[im 13/16]
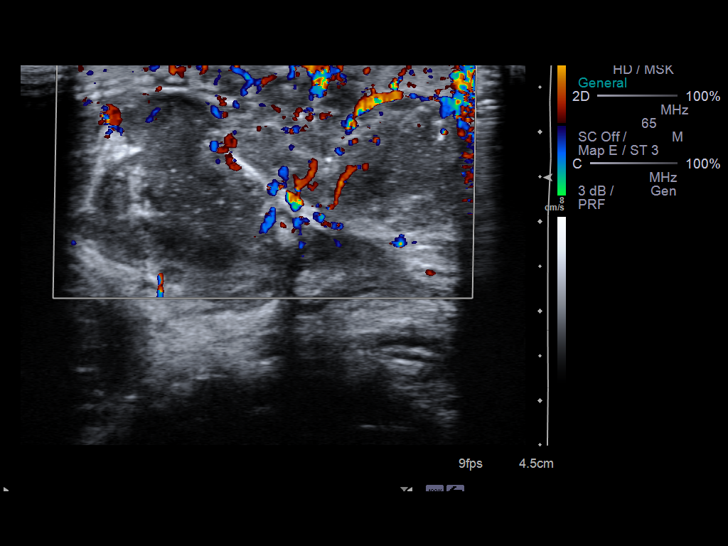
[im 15/16]
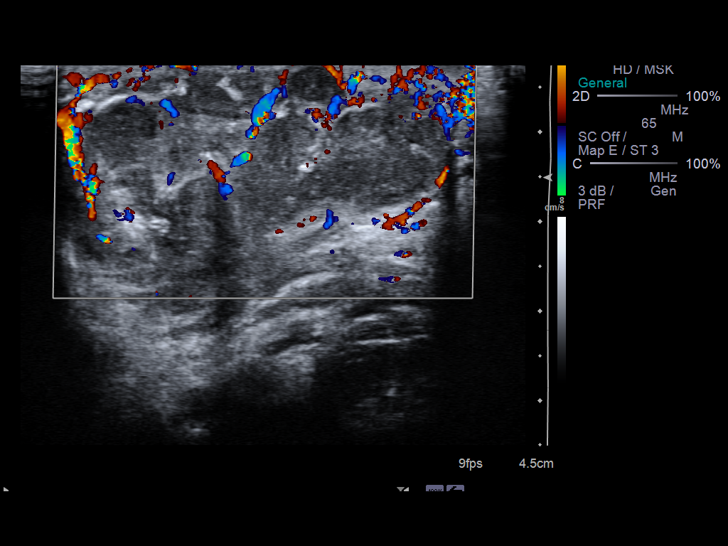
[im 16/16]
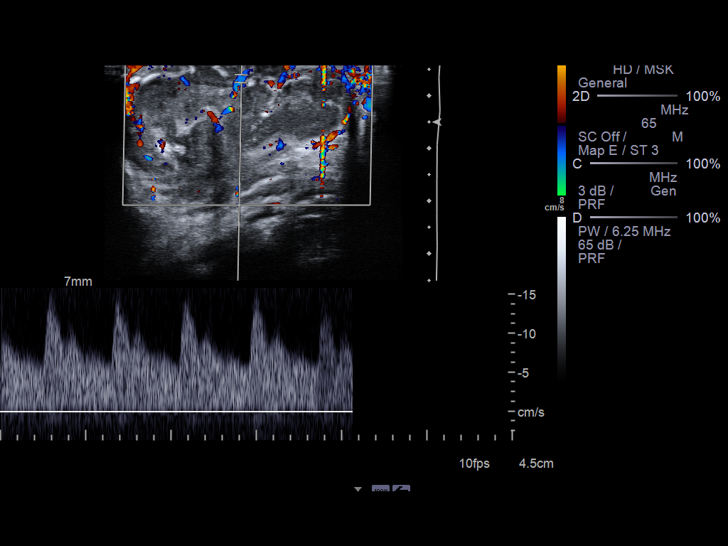

[Series 2: us extrem up*left* ltd · 3 acquisitions, 2 frames shown (2 of 2)]
[im 1/3]
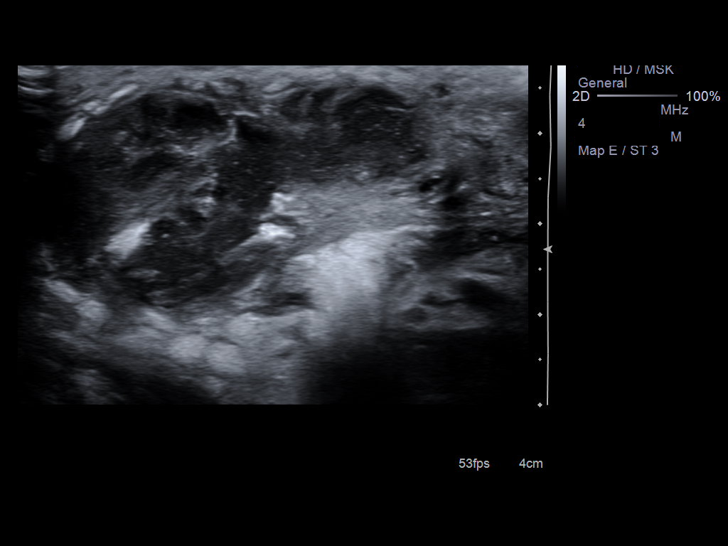
[im 3/3]
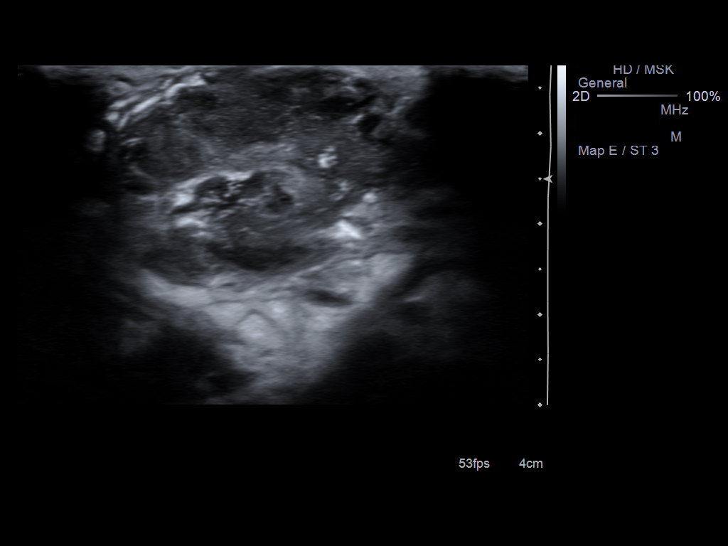

[14 of 19 positions shown; findings below may reference images not displayed]

FINDINGS: In the medial aspect of the left arm there is a complex
collection of heterogeneous echotexture which appears to have the
mobile internal architecture (likely fluid-containing), and
demonstrates some increased through transmission, concerning for a
hematoma. This lesion measures up to 5.7 x 3.0 x 4.7 cm.
Hypervascularity is noted around the periphery of the lesion, and
there is some evidence of internal blood flow.
IMPRESSION: 1. Complex appearing lesion with heterogeneous internal echotexture
measuring 5.7 x 3.0 x 4.7 cm, as detailed above, favored to
represent a hematoma.  Other differential considerations would
include an abscess.  Clinical correlation is recommended, and
repeat imaging may be warranted should this lesion increase in size
or become more symptomatic.

## 2013-09-28 ENCOUNTER — Emergency Department (HOSPITAL_COMMUNITY)
Admission: EM | Admit: 2013-09-28 | Discharge: 2013-09-28 | Disposition: A | Discharge status: Home / Self Care | Payer: Self-pay | Attending: Emergency Medicine | Admitting: Emergency Medicine

## 2013-09-28 ENCOUNTER — Emergency Department (HOSPITAL_COMMUNITY): Payer: Self-pay

## 2013-09-28 ENCOUNTER — Encounter (HOSPITAL_COMMUNITY): Payer: Self-pay | Admitting: *Deleted

## 2013-09-28 DIAGNOSIS — F17200 Nicotine dependence, unspecified, uncomplicated: Secondary | ICD-10-CM | POA: Insufficient documentation

## 2013-09-28 DIAGNOSIS — IMO0001 Reserved for inherently not codable concepts without codable children: Secondary | ICD-10-CM | POA: Insufficient documentation

## 2013-09-28 DIAGNOSIS — M79622 Pain in left upper arm: Secondary | ICD-10-CM

## 2013-09-28 DIAGNOSIS — M7918 Myalgia, other site: Secondary | ICD-10-CM

## 2013-09-28 DIAGNOSIS — Z872 Personal history of diseases of the skin and subcutaneous tissue: Secondary | ICD-10-CM | POA: Insufficient documentation

## 2013-09-28 DIAGNOSIS — M79609 Pain in unspecified limb: Secondary | ICD-10-CM | POA: Insufficient documentation

## 2013-09-28 DIAGNOSIS — R05 Cough: Secondary | ICD-10-CM | POA: Insufficient documentation

## 2013-09-28 DIAGNOSIS — R059 Cough, unspecified: Secondary | ICD-10-CM | POA: Insufficient documentation

## 2013-09-28 LAB — CBC
HCT: 36.8 % — ABNORMAL LOW (ref 39.0–52.0)
Hemoglobin: 12 g/dL — ABNORMAL LOW (ref 13.0–17.0)
MCH: 28.8 pg (ref 26.0–34.0)
MCHC: 32.6 g/dL (ref 30.0–36.0)
RBC: 4.16 MIL/uL — ABNORMAL LOW (ref 4.22–5.81)

## 2013-09-28 LAB — BASIC METABOLIC PANEL
BUN: 7 mg/dL (ref 6–23)
CO2: 26 mEq/L (ref 19–32)
Glucose, Bld: 140 mg/dL — ABNORMAL HIGH (ref 70–99)
Potassium: 4 mEq/L (ref 3.5–5.1)
Sodium: 136 mEq/L (ref 135–145)

## 2013-09-28 LAB — POCT I-STAT TROPONIN I

## 2013-09-28 MED ORDER — KETOROLAC TROMETHAMINE 15 MG/ML IJ SOLN
15.0000 mg | Freq: Once | INTRAMUSCULAR | Status: AC
  Administered 2013-09-28: 15 mg via INTRAMUSCULAR
  Filled 2013-09-28: qty 1

## 2013-09-28 MED ORDER — DIAZEPAM 5 MG PO TABS: 5.0000 mg | ORAL_TABLET | Freq: Two times a day (BID) | ORAL | Status: DC

## 2013-09-28 NOTE — ED Notes (Signed)
Pt sts even though the pain medicine hasn't taken away his pain completely he is ready to go home and just wants to rest in bed.

## 2013-09-28 NOTE — ED Provider Notes (Signed)
CSN: [id number]     Arrival date & time 09/28/13  1305 History   None    Chief Complaint  Patient presents with  . Chest Pain   (Consider location/radiation/quality/duration/timing/severity/associated sxs/prior Treatment) Patient is a 37 y.o. male presenting with chest pain.  Chest Pain Chest pain location: L axilla. Pain quality: sharp   Pain radiates to:  Does not radiate Pain radiates to the back: no   Pain severity:  Severe Onset quality:  Sudden Duration:  12 hours Timing:  Constant Progression:  Unchanged Chronicity:  Recurrent Context: breathing, lifting, movement and raising an arm   Relieved by:  Rest Worsened by:  Certain positions, deep breathing and coughing Ineffective treatments:  None tried Associated symptoms: cough   Associated symptoms: no abdominal pain, no altered mental status, no anxiety, no back pain, no diaphoresis, no dizziness, no fever, no headache, no lower extremity edema, no nausea, no shortness of breath, no syncope and not vomiting     Past Medical History  Diagnosis Date  . Abscess of arm, left     upper   . Weakness    History reviewed. No pertinent past surgical history. Family History  Problem Relation Age of Onset  . Cancer Mother     lung  . Cancer Father     lung   History  Substance Use Topics  . Smoking status: Current Every Day Smoker -- 1.00 packs/day    Types: Cigarettes  . Smokeless tobacco: Never Used  . Alcohol Use: 3.0 oz/week    5 Cans of beer per week     Comment: daily    Review of Systems  Constitutional: Negative for fever, chills and diaphoresis.  HENT: Negative for congestion, sore throat and rhinorrhea.   Eyes: Negative for photophobia and visual disturbance.  Respiratory: Positive for cough. Negative for shortness of breath.   Cardiovascular: Positive for chest pain. Negative for leg swelling and syncope.  Gastrointestinal: Negative for nausea, vomiting, abdominal pain, diarrhea and constipation.   Endocrine: Negative for polydipsia and polyuria.  Genitourinary: Negative for dysuria and hematuria.  Musculoskeletal: Negative for back pain and arthralgias.  Skin: Negative for color change and rash.  Neurological: Negative for dizziness, syncope, light-headedness and headaches.  Hematological: Negative for adenopathy. Does not bruise/bleed easily.  All other systems reviewed and are negative.    Allergies  Review of patient's allergies indicates no known allergies.  Home Medications   Current Outpatient Rx  Name  Route  Sig  Dispense  Refill  . aspirin 325 MG tablet   Oral   Take 325 mg by mouth every 4 (four) hours as needed for pain.          BP 102/99  Pulse 91  Temp(Src) 98.1 F (36.7 C) (Oral)  Resp 20  Ht 6' (1.829 m)  Wt 160 lb (72.576 kg)  BMI 21.7 kg/m2  SpO2 100% Physical Exam  Vitals reviewed. Constitutional: He is oriented to person, place, and time. He appears well-developed and well-nourished.  HENT:  Head: Normocephalic and atraumatic.  Eyes: Conjunctivae and EOM are normal.  Neck: Normal range of motion. Neck supple.  Cardiovascular: Normal rate, regular rhythm and normal heart sounds.   Pulmonary/Chest: Effort normal and breath sounds normal. No respiratory distress. He exhibits tenderness.  Abdominal: He exhibits no distension. There is no tenderness. There is no rebound and no guarding.  Musculoskeletal: Normal range of motion.  Neurological: He is alert and oriented to person, place, and time.  Skin: Skin  is warm and dry.    ED Course  Procedures (including critical care time) Labs Review Labs Reviewed  CBC - Abnormal; Notable for the following:    RBC 4.16 (*)    Hemoglobin 12.0 (*)    HCT 36.8 (*)    All other components within normal limits  BASIC METABOLIC PANEL - Abnormal; Notable for the following:    Glucose, Bld 140 (*)    All other components within normal limits  POCT I-STAT TROPONIN I   Imaging Review Dg Chest 2  View  09/28/2013   CLINICAL DATA:  chest pain  EXAM: CHEST  2 VIEW  COMPARISON:  None.  FINDINGS: Lungs are borderline hyperexpanded but clear. Heart size and pulmonary vascularity are normal. No adenopathy. No bone lesions. No pneumothorax. .  IMPRESSION: No edema or consolidation.   Electronically Signed   By: Bretta Bang   On: 09/28/2013 13:51    Date: 09/28/2013  Rate: 98  Rhythm: normal sinus rhythm  QRS Axis: normal  Intervals: normal  ST/T Wave abnormalities: normal  Conduction Disutrbances:none  Narrative Interpretation: NSR without acute findings  Old EKG Reviewed: No previous   MDM  No diagnosis found. 37 y.o. male  without pertinent PMH presents with axillary pain since last night.  Pain began spontaneous, atraumatic, no antecedent symptoms, fever, gi symptoms.  Pt has had similar symptoms in the past, however states that symptoms today are worse.  Vitals and physical exam on arrival as above with chest wall tenderness and pain with rom in L arm.  No family ho CAD, HTN, HLD.  TIMI 0 prior to labs.  EKG as above.  Likely MSK pain.  Pt given toradol, prescription for valium.  DC home in stable condition.    Labs and imaging as above reviewed by myself and attending,Dr. Rosalia Hammers, with whom case was discussed.   1. Axillary pain, left   2. Musculoskeletal pain         Noel Gerold, MD 09/28/13 [phone]

## 2013-09-28 NOTE — ED Notes (Signed)
Pt reports sharp left side chest pains that started this am. Pain is located under left arm, increases with movement and deep breathing, having productive cough. ekg done at triage, no acute distress noted at this time.

## 2013-09-28 NOTE — ED Notes (Addendum)
Pt sts a few days ago he was having bilateral hand numbness all day then last night his left armpit started hurting, reports the pain radiates into left shoulder blade and around into left chest where nipple is located and to top part of left arm. Pt sts he has taken ASA at home but never got much relief. Pt sts the pain worsens when he tries to move or lift his left arm up. Denies having similar pain before. Denies injury to that area/hx of pinched nerve. Pt in nad, skin warm and dry, resp e/u.

## 2013-09-28 NOTE — ED Provider Notes (Addendum)
  I performed a history and physical examination of Jorge Carter and discussed his management with Dr. Littie Deeds.  I agree with the history, physical, assessment, and plan of care, with the following exceptions: None  I was present for the following procedures: None Time Spent in Critical Care of the patient: None Time spent in discussions with the patient and family: 79  37 y.o. Male with left axilla pain- no evidence of cad, no swelling, no dyspnea.   Holli Humbles, MD 09/28/13 1655  I have reviewed and agree with resident's ekg interpretation.   Hilario Quarry, MD 10/25/13 [phone]

## 2014-01-05 ENCOUNTER — Emergency Department (HOSPITAL_COMMUNITY)
Admission: EM | Admit: 2014-01-05 | Discharge: 2014-01-05 | Disposition: A | Discharge status: Home / Self Care | Payer: Self-pay | Attending: Emergency Medicine | Admitting: Emergency Medicine

## 2014-01-05 ENCOUNTER — Encounter (HOSPITAL_COMMUNITY): Payer: Self-pay | Admitting: Emergency Medicine

## 2014-01-05 DIAGNOSIS — Z872 Personal history of diseases of the skin and subcutaneous tissue: Secondary | ICD-10-CM | POA: Insufficient documentation

## 2014-01-05 DIAGNOSIS — K089 Disorder of teeth and supporting structures, unspecified: Secondary | ICD-10-CM | POA: Insufficient documentation

## 2014-01-05 DIAGNOSIS — F17200 Nicotine dependence, unspecified, uncomplicated: Secondary | ICD-10-CM | POA: Insufficient documentation

## 2014-01-05 DIAGNOSIS — K0889 Other specified disorders of teeth and supporting structures: Secondary | ICD-10-CM

## 2014-01-05 MED ORDER — OXYCODONE-ACETAMINOPHEN 5-325 MG PO TABS: 1.0000 | ORAL_TABLET | ORAL | Status: DC | PRN

## 2014-01-05 MED ORDER — ONDANSETRON 4 MG PO TBDP
8.0000 mg | ORAL_TABLET | Freq: Once | ORAL | Status: AC
  Administered 2014-01-05: 8 mg via ORAL
  Filled 2014-01-05: qty 2

## 2014-01-05 MED ORDER — PENICILLIN V POTASSIUM 500 MG PO TABS: 500.0000 mg | ORAL_TABLET | Freq: Four times a day (QID) | ORAL | Status: DC

## 2014-01-05 MED ORDER — OXYCODONE-ACETAMINOPHEN 5-325 MG PO TABS
2.0000 | ORAL_TABLET | Freq: Once | ORAL | Status: AC
  Administered 2014-01-05: 2 via ORAL
  Filled 2014-01-05: qty 2

## 2014-01-05 NOTE — ED Provider Notes (Signed)
CSN: [id number]     Arrival date & time 01/05/14  1100 History  This chart was scribed for non-physician practitioner, Junious SilkHannah Bryony Kaman, PA-C working with Candyce ChurnJohn David Wofford, MD by Greggory StallionKayla Andersen, ED scribe. This patient was seen in room TR04C/TR04C and the patient's care was started at 12:31 PM.   Chief Complaint  Patient presents with  . Dental Pain   The history is provided by the patient. No language interpreter was used.   HPI Comments: Jorge Carter is a 38 y.o. male who presents to the Emergency Department complaining of gradual onset, constant right upper dental pain that started several weeks ago. The pain is throbbing and radiates through his right jaw. Pt states he has a few broken teeth and has seen some pus drainage. He has taken ibuprofen with no relief. Denies fever.   Past Medical History  Diagnosis Date  . Abscess of arm, left     upper   . Weakness    History reviewed. No pertinent past surgical history. Family History  Problem Relation Age of Onset  . Cancer Mother     lung  . Cancer Father     lung   History  Substance Use Topics  . Smoking status: Current Every Day Smoker -- 1.00 packs/day    Types: Cigarettes  . Smokeless tobacco: Never Used  . Alcohol Use: 3.0 oz/week    5 Cans of beer per week     Comment: daily    Review of Systems  Constitutional: Negative for fever.  HENT: Positive for dental problem.   All other systems reviewed and are negative.    Allergies  Review of patient's allergies indicates no known allergies.  Home Medications   Current Outpatient Rx  Name  Route  Sig  Dispense  Refill  . ibuprofen (ADVIL,MOTRIN) 200 MG tablet   Oral   Take 400 mg by mouth every 6 (six) hours as needed (for tooth pain).          Marland Kitchen. oxyCODONE-acetaminophen (PERCOCET/ROXICET) 5-325 MG per tablet   Oral   Take 1-2 tablets by mouth every 4 (four) hours as needed for severe pain.   15 tablet   0   . penicillin v potassium (VEETID) 500 MG  tablet   Oral   Take 1 tablet (500 mg total) by mouth 4 (four) times daily.   40 tablet   0    BP 119/81  Pulse 75  Temp(Src) 97.6 F (36.4 C) (Oral)  Resp 22  Ht 6' (1.829 m)  Wt 163 lb (73.936 kg)  BMI 22.10 kg/m2  SpO2 93%  Physical Exam  Nursing note and vitals reviewed. Constitutional: He is oriented to person, place, and time. He appears well-developed and well-nourished. No distress.  HENT:  Head: Normocephalic and atraumatic.  Right Ear: External ear normal.  Left Ear: External ear normal.  Nose: Nose normal.  No trismus or submental edema. No drainable abscess. Generally poor dentition.   Eyes: Conjunctivae are normal.  Neck: Normal range of motion. No tracheal deviation present.  Cardiovascular: Normal rate, regular rhythm and normal heart sounds.   Pulmonary/Chest: Effort normal and breath sounds normal. No stridor.  Abdominal: Soft. He exhibits no distension. There is no tenderness.  Musculoskeletal: Normal range of motion.  Neurological: He is alert and oriented to person, place, and time.  Skin: Skin is warm and dry. He is not diaphoretic.  Psychiatric: He has a normal mood and affect. His behavior is normal.  ED Course  Procedures (including critical care time)  DIAGNOSTIC STUDIES: Oxygen Saturation is 93% on RA, adequate by my interpretation.    COORDINATION OF CARE: 12:33 PM-Discussed treatment plan which includes an antibiotic, pain medication and dental referral with pt at bedside and pt agreed to plan.   Labs Review Labs Reviewed - No data to display Imaging Review No results found.  EKG Interpretation   None       MDM   1. Pain, dental    Patient with toothache.  No gross abscess.  Exam unconcerning for Ludwig's angina or spread of infection.  Will treat with penicillin and pain medicine.  Urged patient to follow-up with dentist.     I personally performed the services described in this documentation, which was scribed in my  presence. The recorded information has been reviewed and is accurate.    Mora Bellman, PA-C 01/05/14 [phone]

## 2014-01-05 NOTE — Discharge Instructions (Signed)
Dental Pain Toothache is pain in or around a tooth. It may get worse with chewing or with cold or heat.  HOME CARE  Your dentist may use a numbing medicine during treatment. If so, you may need to avoid eating until the medicine wears off. Ask your dentist about this.  Only take medicine as told by your dentist or doctor.  Avoid chewing food near the painful tooth until after all treatment is done. Ask your dentist about this. GET HELP RIGHT AWAY IF:   The problem gets worse or new problems appear.  You have a fever.  There is redness and puffiness (swelling) of the face, jaw, or neck.  You cannot open your mouth.  There is pain in the jaw.  There is very bad pain that is not helped by medicine. MAKE SURE YOU:   Understand these instructions.  Will watch your condition.  Will get help right away if you are not doing well or get worse. Document Released: 06/03/2008 Document Revised: 03/09/2012 Document Reviewed: 06/03/2008 [facility] Patient Information 2014 [place], [place].   Emergency Department Resource Guide 1) Find a Doctor and Pay Out of Pocket Although you won't have to find out who is covered by your insurance plan, it is a good idea to ask around and get recommendations. You will then need to call the office and see if the doctor you have chosen will accept you as a new patient and what types of options they offer for patients who are self-pay. Some doctors offer discounts or will set up payment plans for their patients who do not have insurance, but you will need to ask so you aren't surprised when you get to your appointment.  2) Contact Your Local Health Department Not all health departments have doctors that can see patients for sick visits, but many do, so it is worth a call to see if yours does. If you don't know where your local health department is, you can check in your phone book. The CDC also has a tool to help you locate your state's health department, and many  state websites also have listings of all of their local health departments.  3) Find a [facility] If your illness is not likely to be very severe or complicated, you may want to try a walk in clinic. These are popping up all over the country in pharmacies, drugstores, and shopping centers. They're usually staffed by nurse practitioners or physician assistants that have been trained to treat common illnesses and complaints. They're usually fairly quick and inexpensive. However, if you have serious medical issues or chronic medical problems, these are probably not your best option.  No Primary Care Doctor: - Call Health Connect at  [phone] - they can help you locate a primary care doctor that  accepts your insurance, provides certain services, etc. - Physician Referral Service- [phone]  Chronic Pain Problems: Organization         Address  Phone   Notes  [facility]  [phone] Patients need to be referred by their primary care doctor.   Medication Assistance: Organization         Address  Phone   Notes  [facility] [address] [place],  [address] [phone] --Must be a resident of [facility] -- Must have NO insurance coverage whatsoever (no Medicaid/ Medicare, etc.) -- The pt. MUST have a primary care doctor that directs their care regularly and follows  them in the community   [facility]ist  [phone]   [employer]  [phone]    Agencies that provide inexpensive medical care: Organization         Address  Phone   Notes  [place]  [phone]   [facility]    [phone]   [facility] [place], [place] [address] [phone]   [place] [address], [place] [phone]   Planned Parenthood    [phone]   [facility]    [phone]   [place] and  [address], [place] Phone:  [phone], Fax:  [phone] Hours of Operation:  9 am - 6 pm, M-F.  Also accepts Medicaid/Medicare and self-pay.  [facility] for [address] [place], [address], [place] Phone: [phone], Fax: [phone]. Hours of Operation:  8:30 am - 5:30 pm, M-F.  Also accepts Medicaid and self-pay.  [facility] [place] [address] [place] Phone: [phone]   [place], [place], [place] [phone], Ext. [phone] Mondays & Thursdays: 7-9 AM.  First 15 patients are seen on a first come, first serve basis.    [place] Providers:  Organization         Address  Phone   Notes  [facility] [address], [place] [phone] Also accepts self-pay patients.  [facility] [address], [place]  [phone]   [place], [address], [place] [phone]   [facility] [address], [place] [phone]   Lucianne Lei [address], [place]   [phone] Only accepts [place] patients after they have their name applied to their card.   Self-Pay (no insurance) in [facility]:  Organization         Address  Phone   Notes  Sickle Cell Patients, [facility] [place] [phone]   [facility] [place] [phone]   [facility] [place]  [place], [place], [place] [phone]   [facility]/Dr. Osei-Bonsu  [address], [place] or [address], [place] [phone] Phone number for both Institute and [place] locations is the same.  [facility] and [facility] [address], [place] [phone]   [facility] [address] [place] or [address] [phone]   [facility] [address], [place] [phone], phone; [phone], fax Sees patients 1st and 3rd Saturday of every month.  Must not qualify for public or private insurance (i.e. Medicaid, Medicare, [place] Health Choice, Veterans' Benefits)  Household income should be no more than 200% of the poverty level The clinic cannot treat you if you are pregnant or think you are pregnant  Sexually transmitted diseases are not treated at the clinic.    Dental Care: Organization         Address  Phone  Notes  [facility] [address] [place], [place] [phone] Accepts children up to age 15 who are enrolled  in Medicaid or [place] Health Choice; pregnant women with a Medicaid card; and children who have applied for Medicaid or [place] Health Choice, but were declined, whose parents can pay a reduced fee at time of service.  °[facility] [place]  [address], [place] ([phone] Accepts children up to age 21 who are enrolled in Medicaid or [place] Health Choice; pregnant women with a Medicaid card; and children who have applied for Medicaid or [place] Health Choice, but were declined, whose parents can pay a reduced fee at time of service.  °[facility] ° [address], [place] ([phone] Patients are seen by appointment only. Walk-ins are not accepted. [facility] will see patients 18 years of age and older. °Monday - Tuesday (8am-5pm) °Most Wednesdays (8:30-5pm) °$30 per visit, cash only  °[facility] ° [address], [place] [phone] Patients are seen by appointment only. Walk-ins are not accepted. [facility] will see patients 18 years of age and older. °One Wednesday Evening (Monthly: Volunteer Based).  $30 per visit, cash only  °[facility]  [phone] for adults; Children under age 4, call Graduate Pediatric Dentistry at [phone]. Children aged 4-14, please call [phone] to request a pediatric application. ° Dental services are provided in all areas of dental care including fillings, crowns and bridges, complete and partial dentures, implants, gum treatment, root canals, and extractions. Preventive care is also provided. Treatment is provided to both adults and children. °Patients are selected via a lottery and there is often a waiting list. °  °[facility] [address], °[place] ° ([phone] [email or web address] °  °[facility] [address], [place], [place] ([phone], Ext. [phone] Second and Fourth Thursday of each month, opens at 6:30 AM; Clinic ends at 9 AM.  Patients are seen on a first-come first-served basis, and a limited number are seen during each clinic.  ° °[facility] ° [address], [place], [place] ([phone]   Eligibility Requirements °You must have lived in [place], [place], or [place] counties for at least the last three months. °  You cannot be eligible for state or federal sponsored healthcare insurance, including Veterans Administration, Medicaid, or Medicare. °  You generally cannot be eligible for healthcare insurance through your employer.  °  How to apply: °Eligibility screenings are held every Tuesday and Wednesday afternoon from 1:00 pm until 4:00 pm. You do not need an appointment for the interview!  °[facility] [address], [place], [place] [phone]   °[facility]  [phone]   °[facility]  [phone]   °[facility]  336-570-6415   ° °Behavioral Health Resources in the Community: °Intensive Outpatient Programs °Organization         Address  Phone  Notes  °[facility] [address], [place], [place]  [phone]   °[facility] [address], [place], [place] [phone]   °[facility] [address], [place], [place] ° 336-882-2125   °[facility] [address],  °[place], [place] 1-800-853-5163 or 336-641-4981   °Substance Abuse Resources °Organization         Address  Phone  Notes  °[facility]  336-882-2125   °[facility]  336-784-9470   °  [facility]   Chinita Pester  (469)[phone]   Residential & Outpatient Substance Abuse Program  (850) [phone]   Psychological Services Organization         Address  Phone  Notes  [facility] [place]  [place]  ([phone]   [facility] [address] [place] or [phone]    Mobile Crisis Teams Organization         Address  Phone  Notes  [facility], Mobile Crisis Care Unit  [phone]   [facility]  [address] [place], [place]   [place] [address], [place] [phone]    Self-Help/Support Groups Organization         Address  Phone             Notes  [place] [place] - variety of support groups  [place] Call for more information  Narcotics Anonymous (NA), [facility] [address], [employer] [place]  2 meetings at this location   Special educational needs teacher         Address  Phone  Notes  [facility] [place],    [place]  1-([phone])472-1036   [facility]  [address], [place] [address], [place], [place]   [place], [place] [phone] Admissions: 8am-3pm M-F  Incentives Substance [place] [address],    [place], [place] [phone]   [facility] [address] [place], [place], [place]   [facility] [address],    [place], [place]   [facility] - Intensive Outpatient [address] Kristeen [address], [place], [place]   [facility] ([facility] [place].,  [place], [place] 1-712-332-1299 or [phone]   [facility] ([facility]) [address], [place], [place] Accepts Medicaid  [facility] [place] [address]  [place] 1-646-085-7975 Substance Abuse/Addiction Treatment   [facility] Organization         Address  Phone  Notes  [facility]  [phone]   Domenic Schwab, PhD [address] Arlis Porta [place], [place]   [phone] or [phone]   [facility] [place], [place] [phone]   [facility] [address] [place], [place] [phone] Insurance/Medicaid/sponsorship through [facility] and Families [address] [place]                                    [place], [place] [phone] [place] [address], [place] [phone]    Dr. Adele Schilder  [phone]   [facility] Dept. 1) [address] [place] 2) [place] 3)  [place] [address], [place] [phone]  [phone]   [place] [phone] or [phone] (After Hours)

## 2014-01-05 NOTE — Progress Notes (Signed)
ED CM received incoming call from patient stating he is unable to afford the price of his antibiotcs. Pt was [facility] and [facility] with a prescription from [facility] 500 mg and Pain meds  for dental pain. Discussed  with patient the [employer] free antibiotic program resource. Pt appreciative and agrees with plan. No further questions or concerns at this time. No further CM needs identified

## 2014-01-05 NOTE — ED Notes (Signed)
Pt is here with question dental abscess to right side

## 2014-01-06 NOTE — ED Provider Notes (Signed)
Medical screening examination/treatment/procedure(s) were performed by non-physician practitioner and as supervising physician I was immediately available for consultation/collaboration.  EKG Interpretation   None         Candyce ChurnJohn David Kiyonna Tortorelli, MD 01/06/14 1423

## 2014-01-26 ENCOUNTER — Encounter (HOSPITAL_COMMUNITY): Payer: Self-pay | Admitting: Emergency Medicine

## 2014-01-26 ENCOUNTER — Emergency Department (HOSPITAL_COMMUNITY): Payer: Self-pay

## 2014-01-26 ENCOUNTER — Emergency Department (HOSPITAL_COMMUNITY)
Admission: EM | Admit: 2014-01-26 | Discharge: 2014-01-26 | Disposition: A | Discharge status: Home / Self Care | Payer: Self-pay | Attending: Emergency Medicine | Admitting: Emergency Medicine

## 2014-01-26 DIAGNOSIS — F17200 Nicotine dependence, unspecified, uncomplicated: Secondary | ICD-10-CM | POA: Insufficient documentation

## 2014-01-26 DIAGNOSIS — R5383 Other fatigue: Secondary | ICD-10-CM

## 2014-01-26 DIAGNOSIS — R5381 Other malaise: Secondary | ICD-10-CM | POA: Insufficient documentation

## 2014-01-26 DIAGNOSIS — R0789 Other chest pain: Secondary | ICD-10-CM | POA: Insufficient documentation

## 2014-01-26 DIAGNOSIS — J069 Acute upper respiratory infection, unspecified: Secondary | ICD-10-CM | POA: Insufficient documentation

## 2014-01-26 DIAGNOSIS — K029 Dental caries, unspecified: Secondary | ICD-10-CM | POA: Insufficient documentation

## 2014-01-26 DIAGNOSIS — F1010 Alcohol abuse, uncomplicated: Secondary | ICD-10-CM | POA: Insufficient documentation

## 2014-01-26 DIAGNOSIS — K0889 Other specified disorders of teeth and supporting structures: Secondary | ICD-10-CM

## 2014-01-26 MED ORDER — GUAIFENESIN-CODEINE 100-10 MG/5ML PO SOLN
5.0000 mL | Freq: Once | ORAL | Status: AC
  Administered 2014-01-26: 5 mL via ORAL
  Filled 2014-01-26: qty 5

## 2014-01-26 MED ORDER — AMOXICILLIN 500 MG PO CAPS: 500.0000 mg | ORAL_CAPSULE | Freq: Three times a day (TID) | ORAL | Status: DC

## 2014-01-26 MED ORDER — GUAIFENESIN-CODEINE 100-10 MG/5ML PO SOLN: 5.0000 mL | Freq: Four times a day (QID) | ORAL | Status: DC | PRN

## 2014-01-26 NOTE — Discharge Instructions (Signed)
Please follow up with your primary care physician in 1-2 days. If you do not have one please call the [facility] number listed above. Please take your antibiotic until completion. Please take pain medication as prescribed and as needed for pain. Please do not drive on narcotic pain medication. Please read all discharge instructions and return precautions.   Upper Respiratory Infection, Adult An upper respiratory infection (URI) is also sometimes known as the common cold. The upper respiratory tract includes the nose, sinuses, throat, trachea, and bronchi. Bronchi are the airways leading to the lungs. Most people improve within 1 week, but symptoms can last up to 2 weeks. A residual cough may last even longer.  CAUSES Many different viruses can infect the tissues lining the upper respiratory tract. The tissues become irritated and inflamed and often become very moist. Mucus production is also common. A cold is contagious. You can easily spread the virus to others by oral contact. This includes kissing, sharing a glass, coughing, or sneezing. Touching your mouth or nose and then touching a surface, which is then touched by another person, can also spread the virus. SYMPTOMS  Symptoms typically develop 1 to 3 days after you come in contact with a cold virus. Symptoms vary from person to person. They may include:  Runny nose.  Sneezing.  Nasal congestion.  Sinus irritation.  Sore throat.  Loss of voice (laryngitis).  Cough.  Fatigue.  Muscle aches.  Loss of appetite.  Headache.  Low-grade fever. DIAGNOSIS  You might diagnose your own cold based on familiar symptoms, since most people get a cold 2 to 3 times a year. Your caregiver can confirm this based on your exam. Most importantly, your caregiver can check that your symptoms are not due to another disease such as strep throat, sinusitis, pneumonia, asthma, or epiglottitis. Blood tests, throat tests, and X-rays are  not necessary to diagnose a common cold, but they may sometimes be helpful in excluding other more serious diseases. Your caregiver will decide if any further tests are required. RISKS AND COMPLICATIONS  You may be at risk for a more severe case of the common cold if you smoke cigarettes, have chronic heart disease (such as heart failure) or lung disease (such as asthma), or if you have a weakened immune system. The very young and very old are also at risk for more serious infections. Bacterial sinusitis, middle ear infections, and bacterial pneumonia can complicate the common cold. The common cold can worsen asthma and chronic obstructive pulmonary disease (COPD). Sometimes, these complications can require emergency medical care and may be life-threatening. PREVENTION  The best way to protect against getting a cold is to practice good hygiene. Avoid oral or hand contact with people with cold symptoms. Wash your hands often if contact occurs. There is no clear evidence that vitamin C, vitamin E, echinacea, or exercise reduces the chance of developing a cold. However, it is always recommended to get plenty of rest and practice good nutrition. TREATMENT  Treatment is directed at relieving symptoms. There is no cure. Antibiotics are not effective, because the infection is caused by a virus, not by bacteria. Treatment may include:  Increased fluid intake. Sports drinks offer valuable electrolytes, sugars, and fluids.  Breathing heated mist or steam (vaporizer or shower).  Eating chicken soup or other clear broths, and maintaining good nutrition.  Getting plenty of rest.  Using gargles or lozenges for comfort.  Controlling fevers with ibuprofen or acetaminophen as directed by  your caregiver.  Increasing usage of your inhaler if you have asthma. Zinc gel and zinc lozenges, taken in the first 24 hours of the common cold, can shorten the duration and lessen the severity of symptoms. Pain medicines may  help with fever, muscle aches, and throat pain. A variety of non-prescription medicines are available to treat congestion and runny nose. Your caregiver can make recommendations and may suggest nasal or lung inhalers for other symptoms.  HOME CARE INSTRUCTIONS   Only take over-the-counter or prescription medicines for pain, discomfort, or fever as directed by your caregiver.  Use a warm mist humidifier or inhale steam from a shower to increase air moisture. This may keep secretions moist and make it easier to breathe.  Drink enough water and fluids to keep your urine clear or pale yellow.  Rest as needed.  Return to work when your temperature has returned to normal or as your caregiver advises. You may need to stay home longer to avoid infecting others. You can also use a face mask and careful hand washing to prevent spread of the virus. SEEK MEDICAL CARE IF:   After the first few days, you feel you are getting worse rather than better.  You need your caregiver's advice about medicines to control symptoms.  You develop chills, worsening shortness of breath, or brown or red sputum. These may be signs of pneumonia.  You develop yellow or brown nasal discharge or pain in the face, especially when you bend forward. These may be signs of sinusitis.  You develop a fever, swollen neck glands, pain with swallowing, or white areas in the back of your throat. These may be signs of strep throat. SEEK IMMEDIATE MEDICAL CARE IF:   You have a fever.  You develop severe or persistent headache, ear pain, sinus pain, or chest pain.  You develop wheezing, a prolonged cough, cough up blood, or have a change in your usual mucus (if you have chronic lung disease).  You develop sore muscles or a stiff neck. Document Released: 06/11/2001 Document Revised: 03/09/2012 Document Reviewed: 04/19/2011 [facility] Patient Information 2014 [place], [place].  Dental Pain A tooth ache may be caused by cavities (tooth  decay). Cavities expose the nerve of the tooth to air and hot or cold temperatures. It may come from an infection or abscess (also called a boil or furuncle) around your tooth. It is also often caused by dental caries (tooth decay). This causes the pain you are having. DIAGNOSIS  Your caregiver can diagnose this problem by exam. TREATMENT   If caused by an infection, it may be treated with medications which kill germs (antibiotics) and pain medications as prescribed by your caregiver. Take medications as directed.  Only take over-the-counter or prescription medicines for pain, discomfort, or fever as directed by your caregiver.  Whether the tooth ache today is caused by infection or dental disease, you should see your dentist as soon as possible for further care. SEEK MEDICAL CARE IF: The exam and treatment you received today has been provided on an emergency basis only. This is not a substitute for complete medical or dental care. If your problem worsens or new problems (symptoms) appear, and you are unable to meet with your dentist, call or return to this location. SEEK IMMEDIATE MEDICAL CARE IF:   You have a fever.  You develop redness and swelling of your face, jaw, or neck.  You are unable to open your mouth.  You have severe pain uncontrolled by pain medicine.  MAKE SURE YOU:   Understand these instructions.  Will watch your condition.  Will get help right away if you are not doing well or get worse. Document Released: 12/16/2005 Document Revised: 03/09/2012 Document Reviewed: 08/03/2008 [facility] Patient Information 2014 [place], [place].  RESOURCE GUIDE  If you do not have a primary care doctor to follow up with regarding today's visit, please call the [facility] at [phone] to make an appointment. Hours of operation are 10am - 7pm, Monday through Friday, and they have a sliding scale fee.    Dental Assistance Please contact the on-call dentist listed  on your discharge papers WITHIN 48 HOURS.  If the on-call dentist agrees that your condition is emergent, there is a CHANCE (not a guarantee) that you MAY receive a savings on your visit at this point. If you wait more than 48 hours, it will not be considered an emergency.   Drs. Moreen Fowler [place]:  [address] [place], [place], [address], [phone]  Short-notice availability  Tooth evaluation: $100  Emergency Treatment: $200 (including exam, xrays, tooth extraction, and post-op visit)  Patients with Medicaid: [facility] [phone] [address] Joellyn Quails, [phone] [address], [phone]  If unable to pay, or uninsured, contact [facility] [phone] in [place], [phone] in [facility]) to become qualified for the adult dental clinic  Other Low-Cost Community Dental Services: - [facility]: [address] [place], [place], [place], [address], [phone], Ext. [phone], 2nd and 4th Thursday of the month at 6:30am.  10 clients each day by appointment, can sometimes see walk-in patients if someone does not show for an appointment. [facility]:  [address] Ether Griffins [place], [place], [address], [phone] [facility]:  [address], [place], [place], [address], [phone] [facility] Department:  [phone] [facility] Health Department:  [phone] [facility] Health Department:  [phone]

## 2014-01-26 NOTE — ED Notes (Signed)
Tooth pain off and on for a month  Upper rt side has had a cough for 4 days running a fevefer

## 2014-01-26 NOTE — ED Provider Notes (Signed)
Medical screening examination/treatment/procedure(s) were performed by non-physician practitioner and as supervising physician I was immediately available for consultation/collaboration.  EKG Interpretation   None         Shanna CiscoMegan E Docherty, MD 01/26/14 2011

## 2014-01-26 NOTE — ED Provider Notes (Signed)
CSN: [id number]     Arrival date & time 01/26/14  1341 History  This chart was scribed for Francee PiccoloJennifer Libby Goehring, PA-C, working with Shanna CiscoMegan E Docherty, MD by Blanchard KelchNicole Curnes, ED Scribe. This patient was seen in room TR06C/TR06C and the patient's care was started at 2:21 PM.     Chief Complaint  Patient presents with  . Dental Pain  . Cough   The history is provided by the patient. No language interpreter was used.    HPI Comments: Jorge Carter is a 38 y.o. male who presents to the Emergency Department complaining of gradual onset, constant right upper dental pain that appeared about four days ago. He was seen on 1/7 for the same pain and was discharged with Penicillin and pain medication. He states that the medication temporarily relived the pain but it has since returned. He denies being seen by a dentist because he states he can't afford it.   He is also complaining of a constant cough with associated chest congestion and fatigue that began four days ago. No alleviating or aggravating factors for URI symptoms. He denies rhinorrhea, sore throat, fever or chills.  Unknown sick contacts.    Past Medical History  Diagnosis Date  . Abscess of arm, left     upper   . Weakness    History reviewed. No pertinent past surgical history. Family History  Problem Relation Age of Onset  . Cancer Mother     lung  . Cancer Father     lung   History  Substance Use Topics  . Smoking status: Current Every Day Smoker -- 1.00 packs/day    Types: Cigarettes  . Smokeless tobacco: Never Used  . Alcohol Use: 3.0 oz/week    5 Cans of beer per week     Comment: daily    Review of Systems  Constitutional: Positive for fatigue. Negative for fever and chills.  HENT: Positive for dental problem. Negative for rhinorrhea and sore throat.   Respiratory: Positive for cough and chest tightness (post tussive).   Gastrointestinal: Negative for vomiting.  All other systems reviewed and are  negative.    Allergies  Review of patient's allergies indicates no known allergies.  Home Medications   Current Outpatient Rx  Name  Route  Sig  Dispense  Refill  . amoxicillin (AMOXIL) 500 MG capsule   Oral   Take 1 capsule (500 mg total) by mouth 3 (three) times daily.   21 capsule   0   . guaiFENesin-codeine 100-10 MG/5ML syrup   Oral   Take 5 mLs by mouth every 6 (six) hours as needed for cough (mouth pain).   120 mL   0    Triage Vitals: BP 115/84  Pulse 67  Temp(Src) 97.5 F (36.4 C)  Resp 16  SpO2 99%  Physical Exam  Nursing note and vitals reviewed. Constitutional: He is oriented to person, place, and time. He appears well-developed and well-nourished. No distress.  HENT:  Head: Normocephalic and atraumatic.  Right Ear: External ear normal.  Left Ear: External ear normal.  Nose: Nose normal.  Mouth/Throat: Uvula is midline, oropharynx is clear and moist and mucous membranes are normal. No trismus in the jaw. Abnormal dentition. Dental caries present. No uvula swelling. No oropharyngeal exudate.  Eyes: Conjunctivae are normal.  Neck: Normal range of motion. Neck supple.  Cardiovascular: Normal rate, regular rhythm and normal heart sounds.   Pulmonary/Chest: Effort normal. No respiratory distress. He exhibits no tenderness.  Dry cough appreciated  on exam.  Abdominal: Soft. There is no tenderness.  Musculoskeletal: Normal range of motion.  Lymphadenopathy:    He has no cervical adenopathy.  Neurological: He is alert and oriented to person, place, and time.  Skin: Skin is warm and dry. He is not diaphoretic.  Psychiatric: He has a normal mood and affect.    ED Course  Procedures (including critical care time) Medications  guaiFENesin-codeine 100-10 MG/5ML solution 5 mL (5 mLs Oral Given 01/26/14 1430)     DIAGNOSTIC STUDIES: Oxygen Saturation is 99% on room air, normal by my interpretation.    COORDINATION OF CARE: 2:57 PM - Patient verbalizes  understanding and agrees with treatment plan.    Labs Review Labs Reviewed - No data to display Imaging Review Dg Chest 2 View  01/26/2014   CLINICAL DATA:  Productive cough.  EXAM: CHEST  2 VIEW  COMPARISON:  09/28/2013  FINDINGS: Mild hyperinflation. Lungs are clear without infiltrate effusion or mass. No change from the prior study.  IMPRESSION: No active cardiopulmonary disease.   Electronically Signed   By: Marlan Palau M.D.   On: 01/26/2014 14:51    EKG Interpretation   None       MDM   1. Upper respiratory infection   2. Pain, dental     Filed Vitals:   01/26/14 1408  BP: 115/84  Pulse: 67  Temp: 97.5 F (36.4 C)  Resp: 16    Afebrile, NAD, non-toxic appearing, AAOx4.   1) URI: Pt CXR negative for acute infiltrate. Patients symptoms are consistent with URI, likely viral etiology. Discussed that antibiotics are not indicated for viral infections. Pt will be discharged with symptomatic treatment.  Verbalizes understanding and is agreeable with plan. Pt is hemodynamically stable & in NAD prior to dc.  2) Dental pain: Patient with toothache.  No gross abscess.  Exam unconcerning for Ludwig's angina or spread of infection.  Will treat with penicillin and pain medicine.  Urged patient to follow-up with dentist.    Return precautions discussed. Patient is agreeable to plan. Patient is stable at time of discharge    I personally performed the services described in this documentation, which was scribed in my presence. The recorded information has been reviewed and is accurate.     Jeannetta Ellis, PA-C 01/26/14 1544

## 2014-03-05 ENCOUNTER — Encounter (HOSPITAL_COMMUNITY): Payer: Self-pay | Admitting: Emergency Medicine

## 2014-03-05 ENCOUNTER — Emergency Department (HOSPITAL_COMMUNITY)
Admission: EM | Admit: 2014-03-05 | Discharge: 2014-03-05 | Disposition: A | Discharge status: Home / Self Care | Payer: Self-pay | Attending: Emergency Medicine | Admitting: Emergency Medicine

## 2014-03-05 DIAGNOSIS — M79609 Pain in unspecified limb: Secondary | ICD-10-CM | POA: Insufficient documentation

## 2014-03-05 DIAGNOSIS — F1010 Alcohol abuse, uncomplicated: Secondary | ICD-10-CM | POA: Insufficient documentation

## 2014-03-05 DIAGNOSIS — Z872 Personal history of diseases of the skin and subcutaneous tissue: Secondary | ICD-10-CM | POA: Insufficient documentation

## 2014-03-05 DIAGNOSIS — F17200 Nicotine dependence, unspecified, uncomplicated: Secondary | ICD-10-CM | POA: Insufficient documentation

## 2014-03-05 DIAGNOSIS — F1120 Opioid dependence, uncomplicated: Secondary | ICD-10-CM | POA: Insufficient documentation

## 2014-03-05 DIAGNOSIS — R748 Abnormal levels of other serum enzymes: Secondary | ICD-10-CM | POA: Insufficient documentation

## 2014-03-05 LAB — RAPID URINE DRUG SCREEN, HOSP PERFORMED
Amphetamines: NOT DETECTED
BARBITURATES: NOT DETECTED
Benzodiazepines: NOT DETECTED
Cocaine: NOT DETECTED
Opiates: POSITIVE — AB
Tetrahydrocannabinol: NOT DETECTED

## 2014-03-05 LAB — CBC WITH DIFFERENTIAL/PLATELET
Basophils Absolute: 0.1 10*3/uL (ref 0.0–0.1)
Basophils Relative: 1 % (ref 0–1)
Eosinophils Absolute: 0.1 10*3/uL (ref 0.0–0.7)
Eosinophils Relative: 1 % (ref 0–5)
HEMATOCRIT: 37.3 % — AB (ref 39.0–52.0)
HEMOGLOBIN: 13 g/dL (ref 13.0–17.0)
LYMPHS PCT: 35 % (ref 12–46)
Lymphs Abs: 3.6 10*3/uL (ref 0.7–4.0)
MCH: 30.4 pg (ref 26.0–34.0)
MCHC: 34.9 g/dL (ref 30.0–36.0)
MCV: 87.1 fL (ref 78.0–100.0)
MONO ABS: 1 10*3/uL (ref 0.1–1.0)
MONOS PCT: 10 % (ref 3–12)
NEUTROS ABS: 5.6 10*3/uL (ref 1.7–7.7)
Neutrophils Relative %: 54 % (ref 43–77)
Platelets: 294 10*3/uL (ref 150–400)
RBC: 4.28 MIL/uL (ref 4.22–5.81)
RDW: 14.7 % (ref 11.5–15.5)
WBC: 10.2 10*3/uL (ref 4.0–10.5)

## 2014-03-05 LAB — COMPREHENSIVE METABOLIC PANEL
ALBUMIN: 3.9 g/dL (ref 3.5–5.2)
ALK PHOS: 114 U/L (ref 39–117)
ALT: 210 U/L — AB (ref 0–53)
AST: 111 U/L — ABNORMAL HIGH (ref 0–37)
BILIRUBIN TOTAL: 0.3 mg/dL (ref 0.3–1.2)
BUN: 6 mg/dL (ref 6–23)
CHLORIDE: 102 meq/L (ref 96–112)
CO2: 21 mEq/L (ref 19–32)
Calcium: 9.1 mg/dL (ref 8.4–10.5)
Creatinine, Ser: 0.72 mg/dL (ref 0.50–1.35)
GFR calc non Af Amer: >90 mL/min (ref >90)
GLUCOSE: 122 mg/dL — AB (ref 70–99)
POTASSIUM: 3.7 meq/L (ref 3.7–5.3)
SODIUM: 139 meq/L (ref 137–147)
Total Protein: 8.8 g/dL — ABNORMAL HIGH (ref 6.0–8.3)

## 2014-03-05 LAB — ETHANOL: Alcohol, Ethyl (B): 18 mg/dL — ABNORMAL HIGH (ref 0–11)

## 2014-03-05 MED ORDER — LOPERAMIDE HCL 2 MG PO CAPS: 2.0000 mg | ORAL_CAPSULE | ORAL | Status: DC | PRN

## 2014-03-05 MED ORDER — METHOCARBAMOL 500 MG PO TABS
500.0000 mg | ORAL_TABLET | Freq: Three times a day (TID) | ORAL | Status: DC | PRN
  Administered 2014-03-05: 500 mg via ORAL
  Filled 2014-03-05: qty 1

## 2014-03-05 MED ORDER — NAPROXEN 250 MG PO TABS
500.0000 mg | ORAL_TABLET | Freq: Two times a day (BID) | ORAL | Status: DC | PRN
  Administered 2014-03-05: 500 mg via ORAL
  Filled 2014-03-05: qty 2

## 2014-03-05 MED ORDER — ACETAMINOPHEN 325 MG PO TABS: 650.0000 mg | ORAL_TABLET | ORAL | Status: DC | PRN

## 2014-03-05 MED ORDER — CLONIDINE HCL 0.1 MG PO TABS: 0.1000 mg | ORAL_TABLET | Freq: Every day | ORAL | Status: DC

## 2014-03-05 MED ORDER — ONDANSETRON HCL 4 MG PO TABS: 4.0000 mg | ORAL_TABLET | Freq: Three times a day (TID) | ORAL | Status: DC | PRN

## 2014-03-05 MED ORDER — LORAZEPAM 1 MG PO TABS: 0.0000 mg | ORAL_TABLET | Freq: Two times a day (BID) | ORAL | Status: DC

## 2014-03-05 MED ORDER — HYDROXYZINE HCL 25 MG PO TABS: 25.0000 mg | ORAL_TABLET | Freq: Four times a day (QID) | ORAL | Status: DC | PRN

## 2014-03-05 MED ORDER — LORAZEPAM 1 MG PO TABS
0.0000 mg | ORAL_TABLET | Freq: Four times a day (QID) | ORAL | Status: DC
  Administered 2014-03-05: 1 mg via ORAL
  Filled 2014-03-05: qty 1

## 2014-03-05 MED ORDER — CLONIDINE HCL 0.1 MG PO TABS
0.1000 mg | ORAL_TABLET | Freq: Four times a day (QID) | ORAL | Status: DC
  Administered 2014-03-05 (×2): 0.1 mg via ORAL
  Filled 2014-03-05 (×2): qty 1

## 2014-03-05 MED ORDER — CLONIDINE HCL 0.1 MG PO TABS: 0.1000 mg | ORAL_TABLET | ORAL | Status: DC

## 2014-03-05 MED ORDER — ZOLPIDEM TARTRATE 5 MG PO TABS: 5.0000 mg | ORAL_TABLET | Freq: Every evening | ORAL | Status: DC | PRN

## 2014-03-05 MED ORDER — NICOTINE 21 MG/24HR TD PT24
21.0000 mg | MEDICATED_PATCH | Freq: Every day | TRANSDERMAL | Status: DC
  Administered 2014-03-05: 21 mg via TRANSDERMAL
  Filled 2014-03-05: qty 1

## 2014-03-05 MED ORDER — DICYCLOMINE HCL 20 MG PO TABS: 20.0000 mg | ORAL_TABLET | Freq: Four times a day (QID) | ORAL | Status: DC | PRN

## 2014-03-05 MED ORDER — ONDANSETRON 4 MG PO TBDP: 4.0000 mg | ORAL_TABLET | Freq: Four times a day (QID) | ORAL | Status: DC | PRN

## 2014-03-05 NOTE — ED Notes (Signed)
The pt is here fro detox from heroin and alcohol.  His last was 1600 and his last heroin  Was before lunch today

## 2014-03-05 NOTE — BH Assessment (Signed)
Tele Assessment Note   Jorge Carter is an 38 y.o. male [facility].  Pt reports being homeless and having no income. Pt reported daily use of heroin for the past 4 years.  Pt reported "I am addicted. I'm ready to get this habit under control." Pt also reported currently experiencing withdrawal symptoms. Pt reported some depression symptoms including sleep difficulties and changes in appetite. Pt reported use of alcohol 1-2x a week.  Pt denied SI, HI and A/V/H.   Pt reported logical and relevant speech. Pt  reported mood as stressed. Pt presented with an anxious affect which was appropriate to circumstance. Pt was Ox4.  Pt has coherent and relevant thought processes.  Pt's motor activity was agitated and restless.   Pt reported [facility].  Pt reported not being prescribed any meds nor having any outpatient services.  Pt identified his sister as a support.   Axis I: Opioid Use Disorder, Severe Axis II: Deferred Axis III:  Past Medical History  Diagnosis Date  . Abscess of arm, left     upper   . Weakness    Axis IV: economic problems, housing problems and problems with primary support group  Past Medical History:  Past Medical History  Diagnosis Date  . Abscess of arm, left     upper   . Weakness     History reviewed. No pertinent past surgical history.  Family History:  Family History  Problem Relation Age of Onset  . Cancer Mother     lung  . Cancer Father     lung    Social History:  reports that he has been smoking Cigarettes.  He has been smoking about 1.00 pack per day. He has never used smokeless tobacco. He reports that he drinks about 3.0 ounces of alcohol per week. He reports that he uses illicit drugs (Heroin).  Additional Social History:  Alcohol / Drug Use Pain Medications: none reported Prescriptions: none reported Over the Counter: none reported History of alcohol /  drug use?: Yes Longest period of sobriety (when/how long): 1 year in 2012 Negative Consequences of Use: Personal relationships;Financial Withdrawal Symptoms: Agitation;Cramps;Sweats;Weakness;Tingling;Fever / Chills;Diarrhea Substance #1 Name of Substance 1: Heroin 1 - Age of First Use: 29 1 - Amount (size/oz): $60 worth 1 - Frequency: daily 1 - Duration: 4 years 1 - Last Use / Amount: 03/04/14 Substance #2 Name of Substance 2: alcohol 2 - Age of First Use: 16 2 - Amount (size/oz): 40 oz 2 - Frequency: 1-2x a week 2 - Duration: ongoing 2 - Last Use / Amount: 03/04/14  CIWA: CIWA-Ar BP: 115/83 mmHg Pulse Rate: 93 Nausea and Vomiting: no nausea and no vomiting Tactile Disturbances: none Tremor: no tremor Auditory Disturbances: not present Paroxysmal Sweats: no sweat visible Visual Disturbances: not present Anxiety: five Headache, Fullness in Head: mild Agitation: two Orientation and Clouding of Sensorium: oriented and can do serial additions CIWA-Ar Total: 9 COWS: Clinical Opiate Withdrawal Scale (COWS) Resting Pulse Rate: Pulse Rate 81-100 Sweating: No report of chills or flushing Restlessness: Able to sit still Pupil Size: Pupils pinned or normal size for room light Bone or Joint Aches: Patient reports sever diffuse aching of joints/muscles Runny Nose or Tearing: Not present GI Upset: No GI symptoms Tremor: No tremor Yawning: No yawning Anxiety or Irritability: None Gooseflesh Skin: Skin is smooth COWS Total Score: 3  Allergies: No Known Allergies  Home Medications:  (  Not in a hospital admission)  OB/GYN Status:  No LMP for male patient.  General Assessment Data Location of Assessment: [facility] ED Is this a Tele or Face-to-Face Assessment?: Tele Assessment Is this an Initial Assessment or a Re-assessment for this encounter?: Initial Assessment Living Arrangements: Other (Comment) (homeless) Can pt return to current living arrangement?: No Admission Status:  Voluntary Is patient capable of signing voluntary admission?: Yes Transfer from: Acute Hospital Referral Source: Self/Family/Friend  Medical Screening Exam [facility] Walk-in ONLY) Medical Exam completed:  (NA)  [facility] Crisis Care Plan Living Arrangements: Other (Comment) (homeless) Name of Psychiatrist:  (none) Name of Therapist:  (none)     Risk to self Suicidal Ideation: No Suicidal Intent: No Is patient at risk for suicide?: No Suicidal Plan?: No Access to Means: No What has been your use of drugs/alcohol within the last 12 months?:  (heroin and etoh) Previous Attempts/Gestures: No How many times?: 0 Other Self Harm Risks:  (none reported) Triggers for Past Attempts: None known Intentional Self Injurious Behavior: None Family Suicide History: Unknown Recent stressful life event(s): Job Loss;Financial Problems Persecutory voices/beliefs?: No Depression: Yes Depression Symptoms: Insomnia;Feeling angry/irritable (decreased appetite) Substance abuse history and/or treatment for substance abuse?: Yes Suicide prevention information given to non-admitted patients: Not applicable  Risk to Others Homicidal Ideation: No Thoughts of Harm to Others: No Current Homicidal Intent: No Current Homicidal Plan: No Access to Homicidal Means: No Identified Victim:  (NA) History of harm to others?: No Assessment of Violence: None Noted Violent Behavior Description:  (Pt was calm and cooperative. ) Does patient have access to weapons?: No Criminal Charges Pending?: No Does patient have a court date: No  Psychosis Hallucinations: None noted Delusions: None noted  Mental Status Report Appear/Hygiene: Other (Comment) (Pt wearing paper scrubs. ) Eye Contact: Fair Motor Activity: Agitation Speech: Logical/coherent Level of Consciousness: Restless Mood: Other (Comment) (Stressed) Affect: Appropriate to circumstance;Anxious Anxiety Level: Moderate Thought Processes:  Coherent;Relevant Judgement: Impaired Orientation: Person;Place;Time;Situation Obsessive Compulsive Thoughts/Behaviors: None  Cognitive Functioning Concentration: Normal Memory: Recent Intact;Remote Intact IQ: Average Insight: Fair Impulse Control: Poor Appetite: Poor Weight Loss:  (unk) Weight Gain:  (unk) Sleep: Decreased Total Hours of Sleep:  (unk) Vegetative Symptoms: None  ADLScreening [facility] Assessment Services) Patient's cognitive ability adequate to safely complete daily activities?: Yes Patient able to express need for assistance with ADLs?: Yes Independently performs ADLs?: Yes (appropriate for developmental age)  Prior Inpatient Therapy Prior Inpatient Therapy: Yes Prior Therapy Dates:  (2x in 2012) Prior Therapy Facilty/Provider(s):  ([facility] and [facility]) Reason for Treatment:  (heroin detox)  Prior Outpatient Therapy Prior Outpatient Therapy: No Prior Therapy Dates:  (NA) Prior Therapy Facilty/Provider(s):  (NA) Reason for Treatment:  (NA)  ADL Screening (condition at time of admission) Patient's cognitive ability adequate to safely complete daily activities?: Yes Is the patient deaf or have difficulty hearing?: No Does the patient have difficulty seeing, even when wearing glasses/contacts?: No Does the patient have difficulty concentrating, remembering, or making decisions?: No Patient able to express need for assistance with ADLs?: Yes Does the patient have difficulty dressing or bathing?: No Independently performs ADLs?: Yes (appropriate for developmental age)       Abuse/Neglect Assessment (Assessment to be complete while patient is alone) Physical Abuse: Denies Verbal Abuse: Denies Sexual Abuse: Denies Exploitation of patient/patient's resources: Denies Self-Neglect: Denies     Merchant navy officer (For Healthcare) Advance Directive: Patient does not have advance directive    Additional Information 1:1 In Past 12 Months?: No CIRT Risk:  No Elopement Risk: No Does patient have medical clearance?: Yes    Disposition: Clinician consulted with Alberteen Sam, NP who recommends inpatient detox. Selena Batten, [facility] reports no available beds at [facility]. TTS will seek alternative placement.  Disposition Initial Assessment Completed for this Encounter: Yes Disposition of Patient: Inpatient treatment program Type of inpatient treatment program: Adult  Stewart,Johnn Krasowski R 03/05/2014 6:13 AM

## 2014-03-05 NOTE — ED Notes (Signed)
Moved to room C24, TTS set up per [facility] "ready", meds given for muscle aches, report given to KironPaul, [place]

## 2014-03-05 NOTE — ED Notes (Signed)
[facility] HAS BEEN CALLED TO TRANSPORT PT TO [facility]

## 2014-03-05 NOTE — Progress Notes (Signed)
[facility] initiated detox placement at [facility] on behalf of pt.  It was confirmed with Jerilynn Somalvin, faxed received but not review yet.  Blain PaisMichelle L Rhyder Koegel, MHT/NS

## 2014-03-05 NOTE — Progress Notes (Signed)
Pt's referral has also been faxed to:  [facility]   [facility]

## 2014-03-05 NOTE — ED Notes (Signed)
Dr. Otter at BS. 

## 2014-03-05 NOTE — ED Provider Notes (Signed)
1:50 PM Pt will be discharged to [facility] for heroin detox.   Clinical Impression 1. Heroin addiction   2. Alcohol abuse   3. Elevated liver enzymes      Junius ArgyleForrest S Mikki Ziff, MD 03/05/14 1351

## 2014-03-05 NOTE — ED Notes (Signed)
Pt is homeless. Has 3 large garbage bags and 3 belongings bags of belongings. Alert, NAD, calm, interactive, skin W&D, resps e/u, speaking in clear complete sentences.here for heroin detox, (denies: SI/HI. Denies: AVH). C/o muscle aches and mild HA. (Denies: other sx. Denies: sob, nvd, chills).  "ETOH is not an issue", last heroin "very little" used yesterday morning, last ETOH 1600, last cigarette 0020, last ate yesterday.  Mentions thirst and hunger. Also mentions some L index and thumb numbness, worse in last 2 weeks.

## 2014-03-05 NOTE — BH Assessment (Signed)
Clinician contacted Dr. Norlene Campbelltter for report prior to assessment. Clinician contacted Jonny RuizJohn, RN to request set up tele-assessment. Assessment to be initiated.  Yaakov Guthrieelilah Stewart, MSW, LCSW Triage Specialist [phone]

## 2014-03-05 NOTE — BH Assessment (Signed)
Clinician consulted with Alberteen SamFran Hobson, NP who recommends inpatient detox.  [facility] BattenKim, [facility].  TTS will seek alternative placement.  Clinician contacted Dr. Norlene Campbelltter to provide recommendation.  Verdia [place]acted Michelle, [facility] for disposition.   Yaakov Guthrieelilah Stewart, MSW, LCSW Triage Specialist [phone]

## 2014-03-05 NOTE — ED Notes (Signed)
Patient with nasal stuffiness. States he has had 1 diarrhea stool. States his main complaints are very bad body aches and being hot and cold. He states that he has a history of alcoholism. States he drinks maybe a 6 pack a week. States he goes for days without drinking alcohol. States he does heroin daily. States he has been to [facility] before and was able to stay clean for over a year. States he has been using this time for about 1.5 years. He denies si/hi

## 2014-03-05 NOTE — ED Notes (Signed)
SPOKE WITH ALAINA AT [facility]. PT IS ACCEPTED PENDING GETTING AUTHORIZATION FROM [facility]. SHE WILL CALL BACK WHEN SHE HAS THE AUTH.

## 2014-03-05 NOTE — ED Provider Notes (Signed)
CSN: [id number]     Arrival date & time 03/05/14  0034 History   First MD Initiated Contact with Patient 03/05/14 [phone]     Chief Complaint  Patient presents with  . detox      (Consider location/radiation/quality/duration/timing/severity/associated sxs/prior Treatment) HPI 38 year old male presents to emergency department with complaint of opiate dependence.  Patient reports that he uses heroin daily, last use was yesterday.  Patient also reports that he drinks heavily on occasion.  He denies having any alcohol withdrawal symptoms.  He reports that he is able to go a few days without drinking.  Patient reports he is beginning to have withdrawal symptoms from heroin.  He reports restless and crampy legs.  Patient has had detox in the past, reports he had fair results after a stay at [facility], and [facility].  Patient uses heroin intravenously.  He denies any current abscesses or injury from his injections.  He denies any psychiatric diagnoses.  He reports that he is concerned that he may have cancer somewhere in his body, as most of his family has recently been diagnosed or died of cancer.  He has no specific symptoms. Past Medical History  Diagnosis Date  . Abscess of arm, left     upper   . Weakness    History reviewed. No pertinent past surgical history. Family History  Problem Relation Age of Onset  . Cancer Mother     lung  . Cancer Father     lung   History  Substance Use Topics  . Smoking status: Current Every Day Smoker -- 1.00 packs/day    Types: Cigarettes  . Smokeless tobacco: Never Used  . Alcohol Use: 3.0 oz/week    5 Cans of beer per week     Comment: daily    Review of Systems   See History of Present Illness; otherwise all other systems are reviewed and negative  Allergies  Review of patient's allergies indicates no known allergies.  Home Medications  No current outpatient prescriptions on file. BP 115/83  Pulse 93  Temp(Src) 98 F (36.7 C) (Oral)   Resp 20  Wt 157 lb 2 oz (71.271 kg)  SpO2 99% Physical Exam  Nursing note and vitals reviewed. Constitutional: He is oriented to person, place, and time. He appears well-developed and well-nourished.  HENT:  Head: Normocephalic and atraumatic.  Nose: Nose normal.  Mouth/Throat: Oropharynx is clear and moist.  Eyes: Conjunctivae and EOM are normal. Pupils are equal, round, and reactive to light.  Neck: Normal range of motion. Neck supple. No JVD present. No tracheal deviation present. No thyromegaly present.  Cardiovascular: Normal rate, regular rhythm, normal heart sounds and intact distal pulses.  Exam reveals no gallop and no friction rub.   No murmur heard. Pulmonary/Chest: Effort normal and breath sounds normal. No stridor. No respiratory distress. He has no wheezes. He has no rales. He exhibits no tenderness.  Abdominal: Soft. Bowel sounds are normal. He exhibits no distension and no mass. There is no tenderness. There is no rebound and no guarding.  Musculoskeletal: Normal range of motion. He exhibits no edema and no tenderness.  Lymphadenopathy:    He has no cervical adenopathy.  Neurological: He is alert and oriented to person, place, and time. He exhibits normal muscle tone. Coordination normal.  Skin: Skin is warm and dry. No rash noted. No erythema. No pallor.  Psychiatric: He has a normal mood and affect. His behavior is normal. Judgment and thought content normal.  ED Course  Procedures (including critical care time) Labs Review Labs Reviewed  URINE RAPID DRUG SCREEN (HOSP PERFORMED) - Abnormal; Notable for the following:    Opiates POSITIVE (*)    All other components within normal limits  CBC WITH DIFFERENTIAL - Abnormal; Notable for the following:    HCT 37.3 (*)    All other components within normal limits  ETHANOL - Abnormal; Notable for the following:    Alcohol, Ethyl (B) 18 (*)    All other components within normal limits  COMPREHENSIVE METABOLIC PANEL -  Abnormal; Notable for the following:    Glucose, Bld 122 (*)    Total Protein 8.8 (*)    AST 111 (*)    ALT 210 (*)    All other components within normal limits   Imaging Review No results found.   EKG Interpretation None      MDM   Final diagnoses:  Heroin addiction  Alcohol abuse  Elevated liver enzymes    38 year old male with opiate addiction.  Patient has been seen by TTS who reports that he gives me criteria for inpatient admission.  There are no current behavioral health beds, but TTS will continue to work on finding him a bed.    Olivia Mackielga M Cayleen Benjamin, MD 03/05/14 [phone]

## 2014-05-06 IMAGING — CR DG CHEST 2V
2 series · 2 of 2 positions shown · non-contrast
Comparison: None.

CLINICAL DATA: chest pain

EXAM:
CHEST  2 VIEW

[w chest pa]
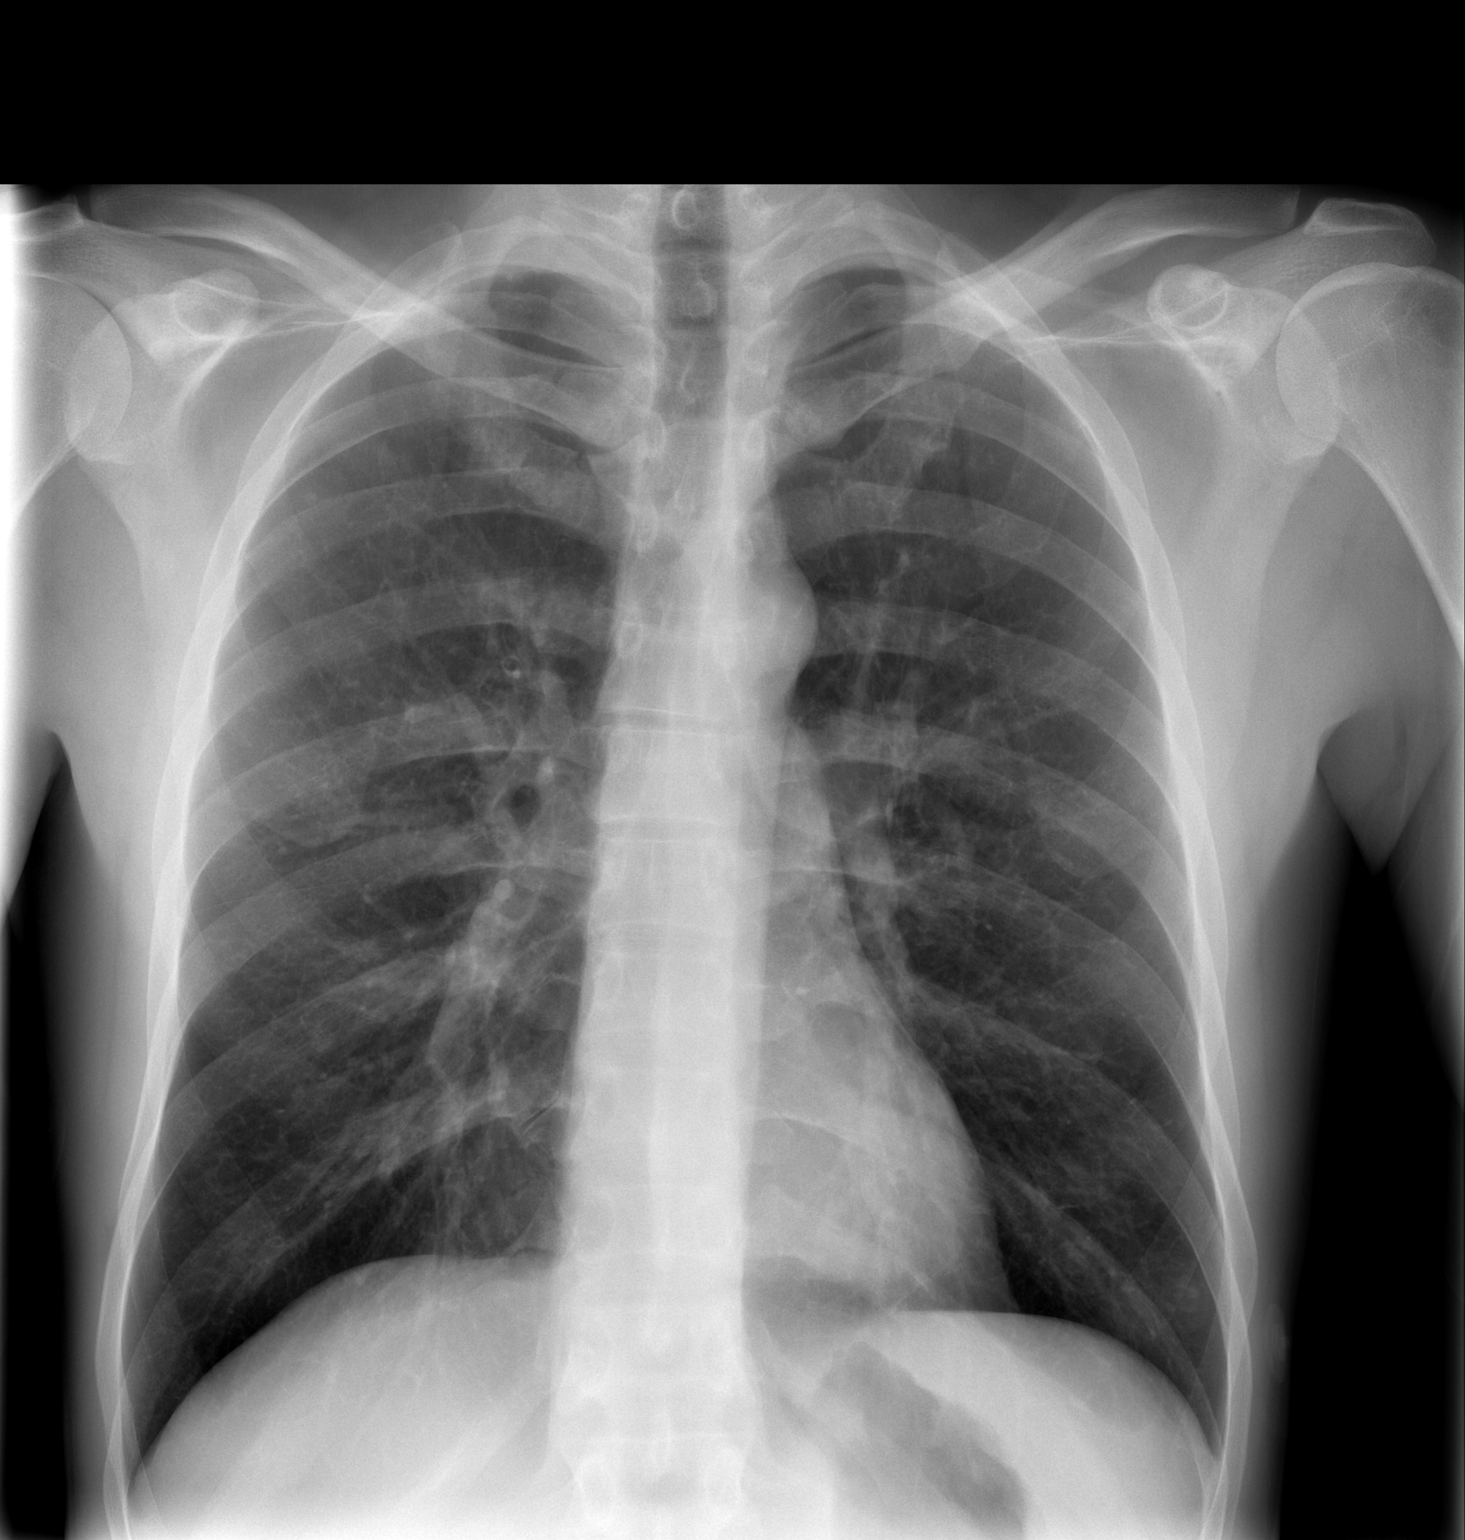

[w chest lat]
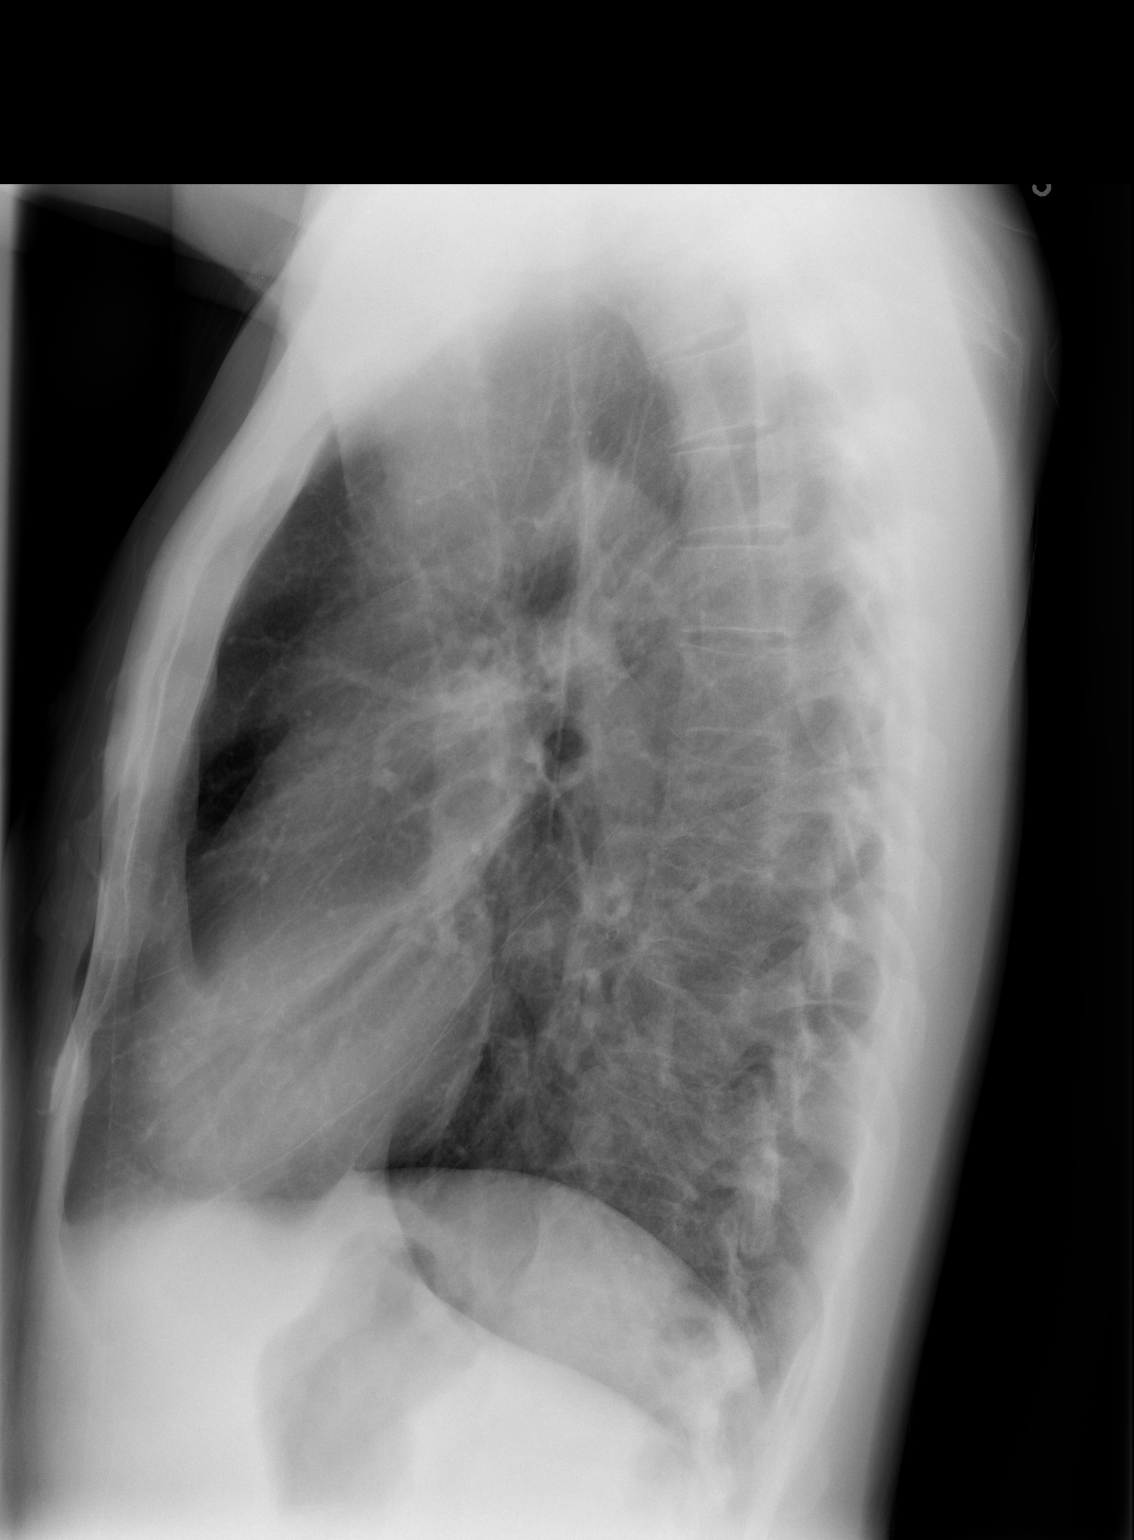

[2 of 2 positions shown; findings below may reference images not displayed]

FINDINGS: Lungs are borderline hyperexpanded but clear. Heart size and
pulmonary vascularity are normal. No adenopathy. No bone lesions. No
pneumothorax. .
IMPRESSION: No edema or consolidation.

## 2022-05-03 DIAGNOSIS — G928 Other toxic encephalopathy: Secondary | ICD-10-CM

## 2024-01-30 DIAGNOSIS — U071 COVID-19: Secondary | ICD-10-CM | POA: Insufficient documentation

## 2024-01-30 DIAGNOSIS — R079 Chest pain, unspecified: Secondary | ICD-10-CM | POA: Insufficient documentation

## 2024-01-30 DIAGNOSIS — R55 Syncope and collapse: Secondary | ICD-10-CM | POA: Insufficient documentation

## 2024-01-30 DIAGNOSIS — R42 Dizziness and giddiness: Secondary | ICD-10-CM | POA: Insufficient documentation

## 2024-01-30 DIAGNOSIS — L299 Pruritus, unspecified: Secondary | ICD-10-CM | POA: Insufficient documentation

## 2024-01-30 DIAGNOSIS — R531 Weakness: Secondary | ICD-10-CM | POA: Insufficient documentation

## 2024-01-30 DIAGNOSIS — R0602 Shortness of breath: Secondary | ICD-10-CM | POA: Insufficient documentation

## 2024-01-30 DIAGNOSIS — R52 Pain, unspecified: Secondary | ICD-10-CM | POA: Insufficient documentation

## 2024-02-02 ENCOUNTER — Ambulatory Visit: Payer: Medicaid Other

## 2024-02-02 NOTE — Progress Notes (Deleted)
 Cardiology Consultation:    Date:  02/02/2024   ID:  Jorge Carter, DOB 19-Aug-1976, MRN [id number]  PCP:  Sherlie Ban, NP  Cardiologist:  Marlyn Corporal Elinor Kleine, MD   Referring MD: Sherlie Ban, NP   No chief complaint on file.    ASSESSMENT AND PLAN:   Mr. Jorge Carter 48 year old Problem List Items Addressed This Visit   None     History of Present Illness:    Jorge Carter is a 48 y.o. male who is being seen today for the evaluation of *** at the request of Sherlie Ban, NP.   Past Medical History:  Diagnosis Date   Abscess of arm, left    upper    Chest pain    COVID-19    Dizziness and giddiness    Pain    Pruritus    Shortness of breath    Syncope and collapse    Tobacco use 02/21/2012   Weakness     Past Surgical History:  Procedure Laterality Date   CLAVICLE SURGERY     NOSE SURGERY      Current Medications: No outpatient medications have been marked as taking for the 02/02/24 encounter (Appointment) with Zlaty Alexa, Marlyn Corporal, MD.     Allergies:   Patient has no known allergies.   Social History   Socioeconomic History   Marital status: Single    Spouse name: Not on file   Number of children: Not on file   Years of education: Not on file   Highest education level: Not on file  Occupational History   Not on file  Tobacco Use   Smoking status: Every Day    Current packs/day: 1.00    Types: Cigarettes   Smokeless tobacco: Never  Substance and Sexual Activity   Alcohol use: Yes    Alcohol/week: 5.0 standard drinks of alcohol    Types: 5 Cans of beer per week    Comment: daily   Drug use: Yes    Types: Heroin    Comment: heroin   Sexual activity: Not on file    Comment: heroin  Other Topics Concern   Not on file  Social History Narrative   Not on file   Social Drivers of Health   Financial Resource Strain: Not on file  Food Insecurity: Not on file  Transportation Needs: Not on file  Physical Activity: Not on file   Stress: Not on file  Social Connections: Not on file     Family History: The patient's family history includes Asthma in his mother; Cancer in his father and mother. ROS:   Please see the history of present illness.    All 14 point review of systems negative except as described per history of present illness.  EKGs/Labs/Other Studies Reviewed:    The following studies were reviewed today:   EKG:       Recent Labs: No results found for requested labs within last 365 days.  Recent Lipid Panel No results found for: "CHOL", "TRIG", "HDL", "CHOLHDL", "VLDL", "LDLCALC", "LDLDIRECT"  Physical Exam:    VS:  There were no vitals taken for this visit.    Wt Readings from Last 3 Encounters:  03/05/14 157 lb 2 oz (71.3 kg)  01/05/14 163 lb (73.9 kg)  09/28/13 160 lb (72.6 kg)     GENERAL:  Well nourished, well developed in no acute distress NECK: No JVD; No carotid bruits CARDIAC: RRR, S1 and S2 present, no murmurs, no rubs, no gallops  CHEST:  Clear to auscultation without rales, wheezing or rhonchi  Extremities: No pitting pedal edema. Pulses bilaterally symmetric with radial 2+ and dorsalis pedis 2+ NEUROLOGIC:  Alert and oriented x 3  Medication Adjustments/Labs and Tests Ordered: Current medicines are reviewed at length with the patient today.  Concerns regarding medicines are outlined above.  No orders of the defined types were placed in this encounter.  No orders of the defined types were placed in this encounter.   Signed, Cecille Amsterdam, MD, MPH, [facility]. 02/02/2024 3:51 PM    [facility]

## 2024-02-10 ENCOUNTER — Ambulatory Visit: Payer: Medicaid Other

## 2024-02-18 ENCOUNTER — Ambulatory Visit: Payer: Medicaid Other

## 2024-03-08 ENCOUNTER — Ambulatory Visit
